# Patient Record
Sex: Female | Born: 1990
Health system: Southern US, Community
[De-identification: ages and names within clinical notes are randomized; demographics above are authoritative.]

## PROBLEM LIST (undated history)

## (undated) DIAGNOSIS — E282 Polycystic ovarian syndrome: Secondary | ICD-10-CM

## (undated) DIAGNOSIS — T7840XA Allergy, unspecified, initial encounter: Secondary | ICD-10-CM

## (undated) HISTORY — PX: WRIST SURGERY: SHX841

## (undated) HISTORY — DX: Allergy, unspecified, initial encounter: T78.40XA

---
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
## 2011-08-19 ENCOUNTER — Ambulatory Visit: Payer: Self-pay | Admitting: Family Medicine

## 2012-02-01 ENCOUNTER — Ambulatory Visit: Payer: Self-pay

## 2012-02-02 ENCOUNTER — Ambulatory Visit: Payer: Self-pay | Admitting: Internal Medicine

## 2012-02-02 LAB — URINALYSIS, COMPLETE
Bilirubin,UR: NEGATIVE
Glucose,UR: NEGATIVE mg/dL (ref 0–75)
Ketone: NEGATIVE
Nitrite: NEGATIVE
Ph: 6 (ref 4.5–8.0)
WBC UR: >30 /HPF (ref 0–5)

## 2012-02-02 LAB — WET PREP, GENITAL

## 2012-02-04 LAB — URINE CULTURE

## 2012-02-06 ENCOUNTER — Ambulatory Visit: Payer: Self-pay | Admitting: Internal Medicine

## 2012-02-18 ENCOUNTER — Ambulatory Visit: Payer: Self-pay

## 2013-03-22 ENCOUNTER — Ambulatory Visit: Payer: Self-pay

## 2014-02-14 ENCOUNTER — Ambulatory Visit: Payer: Self-pay | Admitting: Family Medicine

## 2014-02-14 LAB — URINALYSIS, COMPLETE
Bilirubin,UR: NEGATIVE
Glucose,UR: NEGATIVE mg/dL (ref 0–75)
Ketone: NEGATIVE
Nitrite: POSITIVE
PH: 6 (ref 4.5–8.0)
Protein: 30
Specific Gravity: >=1.03 (ref 1.003–1.030)

## 2014-02-14 LAB — PREGNANCY, URINE: PREGNANCY TEST, URINE: NEGATIVE m[IU]/mL

## 2014-02-16 LAB — URINE CULTURE

## 2014-09-30 ENCOUNTER — Ambulatory Visit: Payer: Self-pay | Admitting: Physician Assistant

## 2015-07-01 ENCOUNTER — Encounter: Payer: Self-pay | Admitting: Family Medicine

## 2015-07-01 ENCOUNTER — Ambulatory Visit (INDEPENDENT_AMBULATORY_CARE_PROVIDER_SITE_OTHER): Payer: 59 | Admitting: Family Medicine

## 2015-07-01 ENCOUNTER — Other Ambulatory Visit
Admission: RE | Admit: 2015-07-01 | Discharge: 2015-07-01 | Disposition: A | Discharge status: Home / Self Care | Payer: 59 | Source: Ambulatory Visit | Attending: Family Medicine | Admitting: Family Medicine

## 2015-07-01 VITALS — BP 122/70 | HR 78 | Temp 98.8°F | Resp 16 | Ht 61.0 in | Wt 211.2 lb

## 2015-07-01 DIAGNOSIS — Z713 Dietary counseling and surveillance: Secondary | ICD-10-CM

## 2015-07-01 DIAGNOSIS — R1084 Generalized abdominal pain: Secondary | ICD-10-CM | POA: Diagnosis not present

## 2015-07-01 DIAGNOSIS — E669 Obesity, unspecified: Secondary | ICD-10-CM

## 2015-07-01 DIAGNOSIS — R110 Nausea: Secondary | ICD-10-CM | POA: Diagnosis not present

## 2015-07-01 LAB — COMPREHENSIVE METABOLIC PANEL
ALK PHOS: 77 U/L (ref 38–126)
ALT: 15 U/L (ref 14–54)
AST: 18 U/L (ref 15–41)
Albumin: 4.2 g/dL (ref 3.5–5.0)
Anion gap: 7 (ref 5–15)
BUN: 13 mg/dL (ref 6–20)
CO2: 25 mmol/L (ref 22–32)
CREATININE: 0.72 mg/dL (ref 0.44–1.00)
Calcium: 9.4 mg/dL (ref 8.9–10.3)
Chloride: 106 mmol/L (ref 101–111)
GFR calc non Af Amer: >60 mL/min (ref >60)
GLUCOSE: 81 mg/dL (ref 65–99)
Potassium: 4 mmol/L (ref 3.5–5.1)
Sodium: 138 mmol/L (ref 135–145)
Total Bilirubin: 0.6 mg/dL (ref 0.3–1.2)
Total Protein: 7.9 g/dL (ref 6.5–8.1)

## 2015-07-01 LAB — POCT URINALYSIS DIPSTICK
Bilirubin, UA: NEGATIVE
Blood, UA: NEGATIVE
Glucose, UA: NEGATIVE
KETONES UA: NEGATIVE
LEUKOCYTES UA: NEGATIVE
Nitrite, UA: NEGATIVE
PH UA: 5
Protein, UA: NEGATIVE
SPEC GRAV UA: 1.02
Urobilinogen, UA: NEGATIVE

## 2015-07-01 LAB — CBC WITH DIFFERENTIAL/PLATELET
Basophils Absolute: 0.1 10*3/uL (ref 0–0.1)
Basophils Relative: 1 %
EOS PCT: 1 %
Eosinophils Absolute: 0.1 10*3/uL (ref 0–0.7)
HCT: 40.3 % (ref 35.0–47.0)
HEMOGLOBIN: 14 g/dL (ref 12.0–16.0)
LYMPHS PCT: 38 %
Lymphs Abs: 3 10*3/uL (ref 1.0–3.6)
MCH: 27.2 pg (ref 26.0–34.0)
MCHC: 34.7 g/dL (ref 32.0–36.0)
MCV: 78.6 fL — AB (ref 80.0–100.0)
MONOS PCT: 8 %
Monocytes Absolute: 0.7 10*3/uL (ref 0.2–0.9)
NEUTROS PCT: 52 %
Neutro Abs: 4.1 10*3/uL (ref 1.4–6.5)
Platelets: 317 10*3/uL (ref 150–440)
RBC: 5.13 MIL/uL (ref 3.80–5.20)
RDW: 15 % — ABNORMAL HIGH (ref 11.5–14.5)
WBC: 7.9 10*3/uL (ref 3.6–11.0)

## 2015-07-01 LAB — POCT URINE PREGNANCY: Preg Test, Ur: NEGATIVE

## 2015-07-01 MED ORDER — ONDANSETRON HCL 4 MG PO TABS: 4.0000 mg | ORAL_TABLET | Freq: Three times a day (TID) | ORAL | Status: DC | PRN

## 2015-07-01 MED ORDER — OMEPRAZOLE 20 MG PO CPDR: 20.0000 mg | DELAYED_RELEASE_CAPSULE | Freq: Every day | ORAL | Status: DC

## 2015-07-01 NOTE — Progress Notes (Signed)
Name: Brenetta Penny   MRN: [id number]    DOB: 1990-12-16   Date:07/01/2015       Progress Note  Subjective  Chief Complaint  Chief Complaint  Patient presents with  . Abdominal Pain    nauseated after eating for 2 weeks    Abdominal Pain This is a recurrent problem. The current episode started 1 to 4 weeks ago. The onset quality is gradual. The problem occurs 2 to 4 times per day. The problem has been gradually worsening. The pain is located in the epigastric region. The pain is mild. The quality of the pain is aching. The abdominal pain does not radiate. Associated symptoms include nausea. Pertinent negatives include no hematochezia, hematuria, vomiting or weight loss. Exacerbated by: Aggravated by work environment which is hot. The pain is relieved by nothing. She has tried nothing for the symptoms. There is no history of abdominal surgery, gallstones, GERD or pancreatitis.      Past Medical History  Diagnosis Date  . Allergy     History  Substance Use Topics  . Smoking status: Never Smoker   . Smokeless tobacco: Not on file  . Alcohol Use: 0.0 oz/week    0 Standard drinks or equivalent per week     Current outpatient prescriptions:  .  cetirizine (ZYRTEC) 10 MG chewable tablet, Chew 10 mg by mouth daily., Disp: , Rfl:   No Known Allergies  Review of Systems  Constitutional: Negative for weight loss.  Gastrointestinal: Positive for nausea and abdominal pain. Negative for vomiting and hematochezia.  Genitourinary: Negative for hematuria.     Objective  Filed Vitals:   07/01/15 0919  BP: 122/70  Pulse: 78  Temp: 98.8 F (37.1 C)  TempSrc: Oral  Resp: 16  Height:  (1.549 m)  Weight: 211 lb 3.2 oz (95.8 kg)  SpO2: 97%     Physical Exam  Constitutional: She is oriented to person, place, and time and well-developed, well-nourished, and in no distress.  Obese  HENT:  Head: Normocephalic.  Eyes: EOM are normal. Pupils are equal, round, and reactive  to light.  Neck: Normal range of motion. No thyromegaly present.  Cardiovascular: Normal rate, regular rhythm and normal heart sounds.   No murmur heard. Pulmonary/Chest: Effort normal and breath sounds normal.  Abdominal: Soft. Bowel sounds are normal. There is tenderness.  Bowel epigastric tenderness without rebound or masses  Musculoskeletal: Normal range of motion. She exhibits no edema.  Neurological: She is alert and oriented to person, place, and time. No cranial nerve deficit. Gait normal.  Skin: Skin is warm and dry. No rash noted.  Psychiatric: Memory and affect normal.      Assessment & Plan  1. Generalized abdominal pain Differential diagnosis includes physiologic response to not eating breakfast, gastritis, gallbladder disease. Also peptic ulcer disease regarding her obesity - POCT urinalysis dipstick - POCT urine pregnancy - omeprazole (PRILOSEC) 20 MG capsule; Take 1 capsule (20 mg total) by mouth daily.  Dispense: 30 capsule; Refill: 3 - CBC with Differential/Platelet - Comprehensive metabolic panel - US Abdomen Complete  2. Dietary counseling Regarding her obesity  3. Nausea  - ondansetron (ZOFRAN) 4 MG tablet; Take 1 tablet (4 mg total) by mouth every 8 (eight) hours as needed for nausea or vomiting.  Dispense: 20 tablet; Refill: 0  4. Obesity Handout

## 2015-07-01 NOTE — Patient Instructions (Signed)
Nausea and Vomiting Nausea is a sick feeling that often comes before throwing up (vomiting). Vomiting is a reflex where stomach contents come out of your mouth. Vomiting can cause severe loss of body fluids (dehydration). Children and elderly adults can become dehydrated quickly, especially if they also have diarrhea. Nausea and vomiting are symptoms of a condition or disease. It is important to find the cause of your symptoms. CAUSES   Direct irritation of the stomach lining. This irritation can result from increased acid production (gastroesophageal reflux disease), infection, food poisoning, taking certain medicines (such as nonsteroidal anti-inflammatory drugs), alcohol use, or tobacco use.  Signals from the brain.These signals could be caused by a headache, heat exposure, an inner ear disturbance, increased pressure in the brain from injury, infection, a tumor, or a concussion, pain, emotional stimulus, or metabolic problems.  An obstruction in the gastrointestinal tract (bowel obstruction).  Illnesses such as diabetes, hepatitis, gallbladder problems, appendicitis, kidney problems, cancer, sepsis, atypical symptoms of a heart attack, or eating disorders.  Medical treatments such as chemotherapy and radiation.  Receiving medicine that makes you sleep (general anesthetic) during surgery. DIAGNOSIS Your caregiver may ask for tests to be done if the problems do not improve after a few days. Tests may also be done if symptoms are severe or if the reason for the nausea and vomiting is not clear. Tests may include:  Urine tests.  Blood tests.  Stool tests.  Cultures (to look for evidence of infection).  X-rays or other imaging studies. Test results can help your caregiver make decisions about treatment or the need for additional tests. TREATMENT You need to stay well hydrated. Drink frequently but in small amounts.You may wish to drink water, sports drinks, clear broth, or eat frozen  ice pops or gelatin dessert to help stay hydrated.When you eat, eating slowly may help prevent nausea.There are also some antinausea medicines that may help prevent nausea. HOME CARE INSTRUCTIONS   Take all medicine as directed by your caregiver.  If you do not have an appetite, do not force yourself to eat. However, you must continue to drink fluids.  If you have an appetite, eat a normal diet unless your caregiver tells you differently.  Eat a variety of complex carbohydrates (rice, wheat, potatoes, bread), lean meats, yogurt, fruits, and vegetables.  Avoid high-fat foods because they are more difficult to digest.  Drink enough water and fluids to keep your urine clear or pale yellow.  If you are dehydrated, ask your caregiver for specific rehydration instructions. Signs of dehydration may include:  Severe thirst.  Dry lips and mouth.  Dizziness.  Dark urine.  Decreasing urine frequency and amount.  Confusion.  Rapid breathing or pulse. SEEK IMMEDIATE MEDICAL CARE IF:   You have blood or brown flecks (like coffee grounds) in your vomit.  You have black or bloody stools.  You have a severe headache or stiff neck.  You are confused.  You have severe abdominal pain.  You have chest pain or trouble breathing.  You do not urinate at least once every 8 hours.  You develop cold or clammy skin.  You continue to vomit for longer than 24 to 48 hours.  You have a fever. MAKE SURE YOU:   Understand these instructions.  Will watch your condition.  Will get help right away if you are not doing well or get worse. Document Released: 11/23/2005 Document Revised: 02/15/2012 Document Reviewed: 04/22/2011 [employer] Patient Information 2015 [employer]. This information is not intended   to replace advice given to you by your health care provider. Make sure you discuss any questions you have with your health care provider. Abdominal Pain Many things can cause abdominal  pain. Usually, abdominal pain is not caused by a disease and will improve without treatment. It can often be observed and treated at home. Your health care provider will do a physical exam and possibly order blood tests and X-rays to help determine the seriousness of your pain. However, in many cases, more time must pass before a clear cause of the pain can be found. Before that point, your health care provider may not know if you need more testing or further treatment. HOME CARE INSTRUCTIONS  Monitor your abdominal pain for any changes. The following actions may help to alleviate any discomfort you are experiencing:  Only take over-the-counter or prescription medicines as directed by your health care provider.  Do not take laxatives unless directed to do so by your health care provider.  Try a clear liquid diet (broth, tea, or water) as directed by your health care provider. Slowly move to a bland diet as tolerated. SEEK MEDICAL CARE IF:  You have unexplained abdominal pain.  You have abdominal pain associated with nausea or diarrhea.  You have pain when you urinate or have a bowel movement.  You experience abdominal pain that wakes you in the night.  You have abdominal pain that is worsened or improved by eating food.  You have abdominal pain that is worsened with eating fatty foods.  You have a fever. SEEK IMMEDIATE MEDICAL CARE IF:   Your pain does not go away within 2 hours.  You keep throwing up (vomiting).  Your pain is felt only in portions of the abdomen, such as the right side or the left lower portion of the abdomen.  You pass bloody or black tarry stools. MAKE SURE YOU:  Understand these instructions.   Will watch your condition.   Will get help right away if you are not doing well or get worse.  Document Released: 09/02/2005 Document Revised: 11/28/2013 Document Reviewed: 08/02/2013 [facility] Patient Information 2015 [place], [place]. This information is not  intended to replace advice given to you by your health care provider. Make sure you discuss any questions you have with your health care provider. Obesity Obesity is defined as having too much total body fat and a body mass index (BMI) of 30 or more. BMI is an estimate of body fat and is calculated from your height and weight. Obesity happens when you consume more calories than you can burn by exercising or performing daily physical tasks. Prolonged obesity can cause major illnesses or emergencies, such as:   Stroke.  Heart disease.  Diabetes.  Cancer.  Arthritis.  High blood pressure (hypertension).  High cholesterol.  Sleep apnea.  Erectile dysfunction.  Infertility problems. CAUSES   Regularly eating unhealthy foods.  Physical inactivity.  Certain disorders, such as an underactive thyroid (hypothyroidism), Cushing's syndrome, and polycystic ovarian syndrome.  Certain medicines, such as steroids, some depression medicines, and antipsychotics.  Genetics.  Lack of sleep. DIAGNOSIS  A health care provider can diagnose obesity after calculating your BMI. Obesity will be diagnosed if your BMI is 30 or higher.  There are other methods of measuring obesity levels. Some other methods include measuring your skinfold thickness, your waist circumference, and comparing your hip circumference to your waist circumference. TREATMENT  A healthy treatment program includes some or all of the following:  Long-term dietary changes.  Exercise and physical activity.  Behavioral and lifestyle changes.  Medicine only under the supervision of your health care provider. Medicines may help, but only if they are used with diet and exercise programs. An unhealthy treatment program includes:  Fasting.  Fad diets.  Supplements and drugs. These choices do not succeed in long-term weight control.  HOME CARE INSTRUCTIONS   Exercise and perform physical activity as directed by your health  care provider. To increase physical activity, try the following:  Use stairs instead of elevators.  Park farther away from store entrances.  Garden, bike, or walk instead of watching television or using the computer.  Eat healthy, low-calorie foods and drinks on a regular basis. Eat more fruits and vegetables. Use low-calorie cookbooks or take healthy cooking classes.  Limit fast food, sweets, and processed snack foods.  Eat smaller portions.  Keep a daily journal of everything you eat. There are many free websites to help you with this. It may be helpful to measure your foods so you can determine if you are eating the correct portion sizes.  Avoid drinking alcohol. Drink more water and drinks without calories.  Take vitamins and supplements only as recommended by your health care provider.  Weight-loss support groups, Government social research officer, counselors, and stress reduction education can also be very helpful. SEEK IMMEDIATE MEDICAL CARE IF:  You have chest pain or tightness.  You have trouble breathing or feel short of breath.  You have weakness or leg numbness.  You feel confused or have trouble talking.  You have sudden changes in your vision. MAKE SURE YOU:  Understand these instructions.  Will watch your condition.  Will get help right away if you are not doing well or get worse. Document Released: 12/31/2004 Document Revised: 04/09/2014 Document Reviewed: 12/30/2011 [facility] Patient Information 2015 [place], [place]. This information is not intended to replace advice given to you by your health care provider. Make sure you discuss any questions you have with your health care provider.

## 2015-07-02 ENCOUNTER — Telehealth: Payer: Self-pay | Admitting: Emergency Medicine

## 2015-07-02 NOTE — Telephone Encounter (Signed)
Patient notified of lab results

## 2015-07-03 ENCOUNTER — Telehealth: Payer: Self-pay

## 2015-07-03 NOTE — Telephone Encounter (Signed)
Add Omeprazole 20 mg daily, #30 Did she get abd [place] yet?

## 2015-07-03 NOTE — Telephone Encounter (Signed)
Pt stated that she is still having bad abdominal pains more so on left side. She has been feeling nauseous and has been attempting to eat. She would like to know what else can be done

## 2015-07-03 NOTE — Telephone Encounter (Signed)
Pt stated that she has appt tomorrow morning for [place] and I also went over her recent lab results with her.

## 2015-07-04 ENCOUNTER — Ambulatory Visit
Admission: RE | Admit: 2015-07-04 | Discharge: 2015-07-04 | Disposition: A | Discharge status: Home / Self Care | Payer: 59 | Source: Ambulatory Visit | Attending: Family Medicine | Admitting: Family Medicine

## 2015-07-04 ENCOUNTER — Telehealth: Payer: Self-pay | Admitting: Family Medicine

## 2015-07-04 DIAGNOSIS — R1084 Generalized abdominal pain: Secondary | ICD-10-CM | POA: Insufficient documentation

## 2015-07-04 MED ORDER — OMEPRAZOLE 20 MG PO CPDR: 20.0000 mg | DELAYED_RELEASE_CAPSULE | Freq: Every day | ORAL | Status: DC

## 2015-07-04 NOTE — Addendum Note (Signed)
Addended by: [name] on: 07/04/2015 09:00 AM   Modules accepted: Orders

## 2015-07-04 NOTE — Telephone Encounter (Signed)
Patient called back in severe pain and was checking status on her ultrasound report. Please return call

## 2015-07-05 NOTE — Telephone Encounter (Signed)
I do not see any results notes on this [place]

## 2015-07-08 ENCOUNTER — Other Ambulatory Visit: Payer: Self-pay

## 2015-07-08 DIAGNOSIS — R1032 Left lower quadrant pain: Secondary | ICD-10-CM

## 2015-07-09 ENCOUNTER — Encounter: Payer: Self-pay | Admitting: Gastroenterology

## 2015-07-10 ENCOUNTER — Encounter: Payer: Self-pay | Admitting: Nurse Practitioner

## 2015-07-25 ENCOUNTER — Ambulatory Visit (INDEPENDENT_AMBULATORY_CARE_PROVIDER_SITE_OTHER): Payer: 59 | Admitting: Nurse Practitioner

## 2015-07-25 ENCOUNTER — Encounter: Payer: Self-pay | Admitting: Nurse Practitioner

## 2015-07-25 VITALS — BP 100/74 | HR 72 | Ht 61.42 in | Wt 214.2 lb

## 2015-07-25 DIAGNOSIS — R1012 Left upper quadrant pain: Secondary | ICD-10-CM

## 2015-07-25 DIAGNOSIS — R110 Nausea: Secondary | ICD-10-CM

## 2015-07-25 MED ORDER — METHOCARBAMOL 500 MG PO TABS: 500.0000 mg | ORAL_TABLET | Freq: Every day | ORAL | Status: DC

## 2015-07-25 MED ORDER — SULINDAC 150 MG PO TABS: 150.0000 mg | ORAL_TABLET | Freq: Every day | ORAL | Status: DC

## 2015-07-25 NOTE — Patient Instructions (Addendum)
We have sent the following medications to your pharmacy for you to pick up at your convenience: Robaxin and Clinoril If no relief in pain after 5 days stop taking these meds  Please start taking omeprazole 30 min before breakfast  We will contact you regarding an appt time for EGD

## 2015-07-25 NOTE — Progress Notes (Signed)
Agree with initial assessment and plans as outlined 

## 2015-07-25 NOTE — Progress Notes (Signed)
HPI :   Patient is 24 year old female referred by PCP. Patient saw her PCP late July for epigastric pain and nausea. She was prescribed omeprazole and Zofran, sent for labs and u/s.  Labs, including a comprehensive metabolic profile and urine pregnancy test were negative. Hemoglobin 14, MCV 78. Ultrasound unremarkable.   Her upper abdominal pain and nausea started 4-6 weeks ago. Only one episode of actual vomiting. Patient does not want to eat because of nausea. She repeated a pregnancy test 2 days ago, it was negative. Patient takes an allergy pill, no other medications. No NSAID use. She did not start omeprazole or Zofran prescribed by PCP,  she wanted to wait for our evaluation. Patient complains of intermittent non-radiating epigastric and right upper quadrant pain.  Pain not related to eating. She notices the pain while standing up at work and also when trying to lay on left side in bed. Patient occasionally lifts heavy boxes at work. No weight loss. Bowel movements are normal.  Patient told she had polycystic ovary syndrome last year. She has not had a menstrual cycle in several months.  Past Medical History  Diagnosis Date  . Allergy     Family History  Problem Relation Age of Onset  . Hypertension Mother   . Diabetes Maternal Grandmother   . Diabetes Mother    Social History  Substance Use Topics  . Smoking status: Never Smoker   . Smokeless tobacco: Never Used  . Alcohol Use: No   Current Outpatient Prescriptions  Medication Sig Dispense Refill  . cetirizine (ZYRTEC) 10 MG chewable tablet Chew 10 mg by mouth daily.    Marland Kitchen omeprazole (PRILOSEC) 20 MG capsule Take 1 capsule (20 mg total) by mouth daily. (Patient not taking: Reported on 07/25/2015) 30 capsule 3  . ondansetron (ZOFRAN) 4 MG tablet Take 1 tablet (4 mg total) by mouth every 8 (eight) hours as needed for nausea or vomiting. (Patient not taking: Reported on 07/25/2015) 20 tablet 0   No current facility-administered  medications for this visit.   No Known Allergies   Review of Systems: All systems reviewed and negative except where noted in HPI.    US Abdomen Complete  07/04/2015   CLINICAL DATA:  Generalized abdominal pain for the past 2 weeks, postprandial nausea, left flank pain.  EXAM: ULTRASOUND ABDOMEN COMPLETE  COMPARISON:  None in PACs  FINDINGS: Gallbladder: No gallstones or wall thickening visualized. No sonographic Murphy sign noted.  Common bile duct: Diameter: 3 mm  Liver: No focal lesion identified. Within normal limits in parenchymal echogenicity.  IVC: Visualized portions unremarkable.  Pancreas: The pancreatic head and body are grossly normal. The pancreatic tail is obscured by bowel gas.  Spleen: Size and appearance within normal limits.  Right Kidney: Length: 8.7 cm. Echogenicity within normal limits. No mass or hydronephrosis visualized.  Left Kidney: Length: 8.9 cm. Echogenicity within normal limits. No mass or hydronephrosis visualized.  Abdominal aorta: No aneurysm visualized.  Other findings: There is no ascites.  IMPRESSION: 1. There is no acute hepatobiliary abnormality. The pancreas was partially obscured by bowel gas. If gallbladder dysfunction remains suspected clinically, nuclear medicine hepatobiliary scanning would be useful. 2. No hydronephrosis or calcified kidney stones are observed.   Electronically Signed   By: David  [place] M.D.   On: 07/04/2015 10:28    Physical Exam: BP 100/74 mmHg  Pulse 72  Ht 5' 1.42" (1.56 m)  Wt 214 lb 4 oz (97.183 kg)  BMI 39.93 kg/m2  LMP 02/27/2015 (Exact Date) Constitutional: Pleasant, obese black female in no acute distress. HEENT: Normocephalic and atraumatic. Conjunctivae are normal. No scleral icterus. Neck supple.  Cardiovascular: Normal rate, regular rhythm.  Pulmonary/chest: Effort normal and breath sounds normal. No wheezing, rales or rhonchi. Abdominal: Soft, nondistended, mild LUQ (lateral) tenderness. Bowel sounds active  throughout. There are no masses palpable. No hepatomegaly. Extremities: no edema Lymphadenopathy: No cervical adenopathy noted. Neurological: Alert and oriented to person place and time. Skin: Skin is warm and dry. No rashes noted. Psychiatric: Normal mood and affect. Behavior is normal.   ASSESSMENT AND PLAN: 59. 24 year old female with 4-6 week history of nausea, predominantly postprandial. Etiology unclear. Two negative pregnancy tests. Gallbladder normal on ultrasound. Labs unremarkable. Rule out gastritis, peptic ulcer disease. For further evaluation patient will be scheduled for upper endoscopy. The benefits, risks, and potential complications of EGD with possible biopsies were discussed with the patient and she agrees to proceed. Patient hasn't yet started Omeprazole but I encouraged her to do so.    2. Left upper abdominal pain. May be GI in origin but based on history and exam musculoskeletal pain also possible.  Trial of Clinoril in am and Robaxin at bedtime for 5 days. If no improvement, discontinue both.  Further recommendations pending clinical course.   3. Obesity  Cc: [name], MD

## 2015-07-26 ENCOUNTER — Encounter: Payer: Self-pay | Admitting: Nurse Practitioner

## 2015-08-06 ENCOUNTER — Telehealth: Payer: Self-pay | Admitting: *Deleted

## 2015-08-06 ENCOUNTER — Encounter: Payer: Self-pay | Admitting: *Deleted

## 2015-08-06 NOTE — Telephone Encounter (Signed)
Called patient several times to schedule appt for [facility], no answer msg left to contact office.

## 2015-08-06 NOTE — Telephone Encounter (Signed)
-----   Message from Lamonte Sakai, RN sent at 07/25/2015 11:28 AM EDT ----- Regarding: Needs EGD No open spots at this time in [facility] for EGD with Dr. Marina Goodell. Will check back in a few days to see if schedule opened up. Pt will need appt with pre visit to go over prep. Needs to be scheduled for [facility] with Dr. Marina Goodell.

## 2015-08-14 NOTE — Telephone Encounter (Signed)
Spoke to patient and she refused to schedule appt for EGD with Dr. Marina Goodell. She states that she is feeling so much better and will contact us if her symptoms return or get worse.

## 2015-08-19 NOTE — Telephone Encounter (Signed)
Okay, please convert this to a phone note. Thanks

## 2015-08-20 ENCOUNTER — Ambulatory Visit: Payer: 59 | Admitting: Gastroenterology

## 2015-12-16 ENCOUNTER — Ambulatory Visit (INDEPENDENT_AMBULATORY_CARE_PROVIDER_SITE_OTHER): Payer: 59

## 2015-12-16 ENCOUNTER — Ambulatory Visit
Admission: EM | Admit: 2015-12-16 | Discharge: 2015-12-16 | Disposition: A | Discharge status: Home / Self Care | Payer: 59 | Attending: Family Medicine | Admitting: Family Medicine

## 2015-12-16 ENCOUNTER — Encounter: Payer: Self-pay | Admitting: *Deleted

## 2015-12-16 DIAGNOSIS — S161XXA Strain of muscle, fascia and tendon at neck level, initial encounter: Secondary | ICD-10-CM

## 2015-12-16 DIAGNOSIS — S63501A Unspecified sprain of right wrist, initial encounter: Secondary | ICD-10-CM

## 2015-12-16 DIAGNOSIS — S6991XA Unspecified injury of right wrist, hand and finger(s), initial encounter: Secondary | ICD-10-CM | POA: Diagnosis not present

## 2015-12-16 DIAGNOSIS — S199XXA Unspecified injury of neck, initial encounter: Secondary | ICD-10-CM | POA: Diagnosis not present

## 2015-12-16 DIAGNOSIS — W19XXXA Unspecified fall, initial encounter: Secondary | ICD-10-CM

## 2015-12-16 DIAGNOSIS — M542 Cervicalgia: Secondary | ICD-10-CM | POA: Diagnosis not present

## 2015-12-16 DIAGNOSIS — M25531 Pain in right wrist: Secondary | ICD-10-CM | POA: Diagnosis not present

## 2015-12-16 MED ORDER — CYCLOBENZAPRINE HCL 10 MG PO TABS: 10.0000 mg | ORAL_TABLET | Freq: Every day | ORAL | Status: DC

## 2015-12-16 MED ORDER — HYDROCODONE-ACETAMINOPHEN 5-325 MG PO TABS: 1.0000 | ORAL_TABLET | Freq: Four times a day (QID) | ORAL | Status: DC | PRN

## 2015-12-16 NOTE — ED Provider Notes (Signed)
CSN: [id number]     Arrival date & time 12/16/15  0914 History   First MD Initiated Contact with Patient 12/16/15 [phone]     Chief Complaint  Patient presents with  . Neck Injury  . Back Pain   (Consider location/radiation/quality/duration/timing/severity/associated sxs/prior Treatment) HPI Comments: 25 yo female presents with a c/o right hand/wrist pain and neck pain since falling on ice yesterday. Denies hitting her head or loss of consciousness.   The history is provided by the patient.    Past Medical History  Diagnosis Date  . Allergy    Past Surgical History  Procedure Laterality Date  . Wrist surgery Left    Family History  Problem Relation Age of Onset  . Hypertension Mother   . Diabetes Maternal Grandmother   . Diabetes Mother    Social History  Substance Use Topics  . Smoking status: Never Smoker   . Smokeless tobacco: Never Used  . Alcohol Use: No   OB History    No data available     Review of Systems  Allergies  Review of patient's allergies indicates no known allergies.  Home Medications   Prior to Admission medications   Medication Sig Start Date End Date Taking? Authorizing Provider  cetirizine (ZYRTEC) 10 MG chewable tablet Chew 10 mg by mouth daily.   Yes Historical Provider, MD  cyclobenzaprine (FLEXERIL) 10 MG tablet Take 1 tablet (10 mg total) by mouth at bedtime. 12/16/15   Payton Mccallumrlando Beau Vanduzer, MD  HYDROcodone-acetaminophen (NORCO/VICODIN) 5-325 MG tablet Take 1-2 tablets by mouth every 6 (six) hours as needed. 12/16/15   Payton Mccallumrlando Kobie Whidby, MD  methocarbamol (ROBAXIN) 500 MG tablet Take 1 tablet (500 mg total) by mouth at bedtime. For 5 days 07/25/15   Meredith PelPaula M Guenther, NP  omeprazole (PRILOSEC) 20 MG capsule Take 1 capsule (20 mg total) by mouth daily. 07/04/15   Dennison MascotLemont Morrisey, MD  sulindac (CLINORIL) 150 MG tablet Take 1 tablet (150 mg total) by mouth daily. In the morning after breakfast for 5 days 07/25/15   Meredith PelPaula M Guenther, NP   Meds Ordered and  Administered this Visit  Medications - No data to display  BP 118/79 mmHg  Pulse 71  Temp(Src) 97.9 F (36.6 C) (Oral)  Resp 18  Ht 5\' 2"  (1.575 m)  Wt 210 lb (95.255 kg)  BMI 38.40 kg/m2  SpO2 97%  LMP 12/13/2015 No data found.   Physical Exam  Constitutional: She appears well-developed and well-nourished. No distress.  Musculoskeletal: She exhibits tenderness. She exhibits no edema.       Right wrist: She exhibits bony tenderness and swelling. She exhibits normal range of motion, no effusion, no crepitus, no deformity and no laceration.       Thoracic back: She exhibits tenderness (mild over the paraspinous muscles) and spasm. She exhibits normal range of motion, no bony tenderness, no swelling, no edema, no deformity, no laceration, no pain and normal pulse.       Lumbar back: She exhibits normal range of motion, no bony tenderness, no swelling, no edema, no deformity, no laceration, no pain and normal pulse.       Right hand: Normal. Normal sensation noted. Normal strength noted.  Neurological: She is alert. She has normal reflexes. She exhibits normal muscle tone.  Skin: Skin is warm and dry. No rash noted. She is not diaphoretic. No erythema.  Nursing note and vitals reviewed.   ED Course  Procedures (including critical care time)  Labs Review Labs Reviewed -  No data to display  Imaging Review No results found.   Visual Acuity Review  Right Eye Distance:   Left Eye Distance:   Bilateral Distance:    Right Eye Near:   Left Eye Near:    Bilateral Near:         MDM   1. Wrist sprain, right, initial encounter   2. Neck strain, initial encounter   3. Fall, initial encounter    Discharge Medication List as of 12/16/2015 10:44 AM    START taking these medications   Details  cyclobenzaprine (FLEXERIL) 10 MG tablet Take 1 tablet (10 mg total) by mouth at bedtime., Starting 12/16/2015, Until Discontinued, Normal    HYDROcodone-acetaminophen (NORCO/VICODIN)  5-325 MG tablet Take 1-2 tablets by mouth every 6 (six) hours as needed., Starting 12/16/2015, Until Discontinued, Print       1. x-ray results (negative for acute fracture) and diagnosis reviewed with patient 2. rx as per orders above; reviewed possible side effects, interactions, risks and benefits  3. Recommend supportive treatment with rest, ice, otc analgesics prn 4. Follow-up prn if symptoms worsen or don't improve    Payton Mccallum, MD 01/01/16 [phone]

## 2015-12-16 NOTE — ED Notes (Signed)
Patient reports falling on ice yesterday 12/14/14 and hurting her right hand, neck and back.  Swelling is evident in patient's right hand. Patient reports that she is unable to find a comfort position.

## 2015-12-16 NOTE — Discharge Instructions (Signed)
Cervical Sprain °A cervical sprain is an injury in the neck in which the strong, fibrous tissues (ligaments) that connect your neck bones stretch or tear. Cervical sprains can range from mild to severe. Severe cervical sprains can cause the neck vertebrae to be unstable. This can lead to damage of the spinal cord and can result in serious nervous system problems. The amount of time it takes for a cervical sprain to get better depends on the cause and extent of the injury. Most cervical sprains heal in 1 to 3 weeks. °CAUSES  °Severe cervical sprains may be caused by:  °· Contact sport injuries (such as from football, rugby, wrestling, hockey, auto racing, gymnastics, diving, martial arts, or boxing).   °· Motor vehicle collisions.   °· Whiplash injuries. This is an injury from a sudden forward and backward whipping movement of the head and neck.  °· Falls.   °Mild cervical sprains may be caused by:  °· Being in an awkward position, such as while cradling a telephone between your ear and shoulder.   °· Sitting in a chair that does not offer proper support.   °· Working at a poorly designed computer station.   °· Looking up or down for long periods of time.   °SYMPTOMS  °· Pain, soreness, stiffness, or a burning sensation in the front, back, or sides of the neck. This discomfort may develop immediately after the injury or slowly, 24 hours or more after the injury.   °· Pain or tenderness directly in the middle of the back of the neck.   °· Shoulder or upper back pain.   °· Limited ability to move the neck.   °· Headache.   °· Dizziness.   °· Weakness, numbness, or tingling in the hands or arms.   °· Muscle spasms.   °· Difficulty swallowing or chewing.   °· Tenderness and swelling of the neck.   °DIAGNOSIS  °Most of the time your health care provider can diagnose a cervical sprain by taking your history and doing a physical exam. Your health care provider will ask about previous neck injuries and any known neck  problems, such as arthritis in the neck. X-rays may be taken to find out if there are any other problems, such as with the bones of the neck. Other tests, such as a CT scan or MRI, may also be needed.  °TREATMENT  °Treatment depends on the severity of the cervical sprain. Mild sprains can be treated with rest, keeping the neck in place (immobilization), and pain medicines. Severe cervical sprains are immediately immobilized. Further treatment is done to help with pain, muscle spasms, and other symptoms and may include: °· Medicines, such as pain relievers, numbing medicines, or muscle relaxants.   °· Physical therapy. This may involve stretching exercises, strengthening exercises, and posture training. Exercises and improved posture can help stabilize the neck, strengthen muscles, and help stop symptoms from returning.   °HOME CARE INSTRUCTIONS  °· Put ice on the injured area.   °¨ Put ice in a plastic bag.   °¨ Place a towel between your skin and the bag.   °¨ Leave the ice on for 15-20 minutes, 3-4 times a day.   °· If your injury was severe, you may have been given a cervical collar to wear. A cervical collar is a two-piece collar designed to keep your neck from moving while it heals. °¨ Do not remove the collar unless instructed by your health care provider. °¨ If you have long hair, keep it outside of the collar. °¨ Ask your health care provider before making any adjustments to your collar. Minor   adjustments may be required over time to improve comfort and reduce pressure on your chin or on the back of your head.  Ifyou are allowed to remove the collar for cleaning or bathing, follow your health care provider's instructions on how to do so safely.  Keep your collar clean by wiping it with mild soap and water and drying it completely. If the collar you have been given includes removable pads, remove them every 1-2 days and hand wash them with soap and water. Allow them to air dry. They should be completely  dry before you wear them in the collar.  If you are allowed to remove the collar for cleaning and bathing, wash and dry the skin of your neck. Check your skin for irritation or sores. If you see any, tell your health care provider.  Do not drive while wearing the collar.   Only take over-the-counter or prescription medicines for pain, discomfort, or fever as directed by your health care provider.   Keep all follow-up appointments as directed by your health care provider.   Keep all physical therapy appointments as directed by your health care provider.   Make any needed adjustments to your workstation to promote good posture.   Avoid positions and activities that make your symptoms worse.   Warm up and stretch before being active to help prevent problems.  SEEK MEDICAL CARE IF:   Your pain is not controlled with medicine.   You are unable to decrease your pain medicine over time as planned.   Your activity level is not improving as expected.  SEEK IMMEDIATE MEDICAL CARE IF:   You develop any bleeding.  You develop stomach upset.  You have signs of an allergic reaction to your medicine.   Your symptoms get worse.   You develop new, unexplained symptoms.   You have numbness, tingling, weakness, or paralysis in any part of your body.  MAKE SURE YOU:   Understand these instructions.  Will watch your condition.  Will get help right away if you are not doing well or get worse.   This information is not intended to replace advice given to you by your health care provider. Make sure you discuss any questions you have with your health care provider.   Document Released: 09/20/2007 Document Revised: 11/28/2013 Document Reviewed: 05/31/2013 Elsevier Interactive Patient Education 2016 [employer] Wrist Pain There are many things that can cause wrist pain. Some common causes include:  An injury to the wrist area, such as a sprain, strain, or  fracture.  Overuse of the joint.  A condition that causes increased pressure on a nerve in the wrist (carpal tunnel syndrome).  Wear and tear of the joints that occurs with aging (osteoarthritis).  A variety of other types of arthritis. Sometimes, the cause of wrist pain is not known. The pain often goes away when you follow your health care provider's instructions for relieving pain at home. If your wrist pain continues, tests may need to be done to diagnose your condition. HOME CARE INSTRUCTIONS Pay attention to any changes in your symptoms. Take these actions to help with your pain:  Rest the wrist area for at least 48 hours or as told by your health care provider.  If directed, apply ice to the injured area:  Put ice in a plastic bag.  Place a towel between your skin and the bag.  Leave the ice on for 20 minutes, 2-3 times per day.  Keep your arm raised (elevated) above  the level of your heart while you are sitting or lying down.  If a splint or elastic bandage has been applied, use it as told by your health care provider.  Remove the splint or bandage only as told by your health care provider.  Loosen the splint or bandage if your fingers become numb or have a tingling feeling, or if they turn cold or blue.  Take over-the-counter and prescription medicines only as told by your health care provider.  Keep all follow-up visits as told by your health care provider. This is important. SEEK MEDICAL CARE IF:  Your pain is not helped by treatment.  Your pain gets worse. SEEK IMMEDIATE MEDICAL CARE IF:  Your fingers become swollen.  Your fingers turn white, very red, or cold and blue.  Your fingers are numb or have a tingling feeling.  You have difficulty moving your fingers.   This information is not intended to replace advice given to you by your health care provider. Make sure you discuss any questions you have with your health care provider.   Document Released:  09/02/2005 Document Revised: 08/14/2015 Document Reviewed: 04/10/2015 Elsevier Interactive Patient Education [employer]

## 2016-02-04 ENCOUNTER — Ambulatory Visit (INDEPENDENT_AMBULATORY_CARE_PROVIDER_SITE_OTHER): Payer: 59 | Admitting: Family Medicine

## 2016-02-04 ENCOUNTER — Encounter: Payer: Self-pay | Admitting: Family Medicine

## 2016-02-04 ENCOUNTER — Other Ambulatory Visit
Admission: RE | Admit: 2016-02-04 | Discharge: 2016-02-04 | Disposition: A | Discharge status: Home / Self Care | Payer: 59 | Source: Ambulatory Visit | Attending: Family Medicine | Admitting: Family Medicine

## 2016-02-04 VITALS — BP 116/78 | HR 110 | Temp 98.0°F | Resp 14 | Ht 62.0 in | Wt 219.3 lb

## 2016-02-04 DIAGNOSIS — E282 Polycystic ovarian syndrome: Secondary | ICD-10-CM

## 2016-02-04 DIAGNOSIS — N926 Irregular menstruation, unspecified: Secondary | ICD-10-CM | POA: Insufficient documentation

## 2016-02-04 LAB — HCG, QUANTITATIVE, PREGNANCY: hCG, Beta Chain, Quant, S: <1 m[IU]/mL (ref <5)

## 2016-02-04 LAB — POCT URINE PREGNANCY: Preg Test, Ur: NEGATIVE

## 2016-02-04 NOTE — Progress Notes (Signed)
Name: Brenda Reese   MRN: [id number]    DOB: Mar 01, 1991   Date:02/04/2016       Progress Note  Subjective  Chief Complaint  Chief Complaint  Patient presents with  . Amenorrhea    Patient here for pregnancy test today.    HPI  Brenda Reese is a 25 year old female patient who is here today with concerns of missed menses. LMP 01/02/16. She reports a history of PCOS and [facility] on [facility] but when her OCP brand changed she had prolonged menses for 2 months so her OCPs were discontinued in 2016. It took her 6 months of anovulation and no menses before finally having a menses, now they are irregular and hard to predict. She did have unprotected sexual intercourse. Not having pelvic pain, discharge, mood swings, fatigue.    Past Medical History  Diagnosis Date  . Allergy     Patient Active Problem List   Diagnosis Date Noted  . Abdominal pain, left upper quadrant 07/25/2015  . Nausea without vomiting 07/25/2015  . Generalized abdominal pain 07/01/2015  . Dietary counseling 07/01/2015  . Nausea 07/01/2015  . Obesity 07/01/2015    Social History  Substance Use Topics  . Smoking status: Never Smoker   . Smokeless tobacco: Never Used  . Alcohol Use: No     Current outpatient prescriptions:  .  cetirizine (ZYRTEC) 10 MG chewable tablet, Chew 10 mg by mouth daily., Disp: , Rfl:   No Known Allergies  Review of Systems  Positive for amenorrhea as mentioned in HPI, otherwise all systems reviewed and are negative.  Objective  BP 116/78 mmHg  Pulse 110  Temp(Src) 98 F (36.7 C) (Oral)  Resp 14  Ht  (1.575 m)  Wt 219 lb 4.8 oz (99.474 kg)  BMI 40.10 kg/m2  SpO2 96%  LMP 01/02/2016 (Exact Date)  Body mass index is 40.1 kg/(m^2).   Physical Exam  Constitutional: Patient is obese and well-nourished. In no distress.   Cardiovascular: Normal rate, regular rhythm and normal heart sounds.  No murmur heard.  Pulmonary/Chest:  Effort normal and breath sounds normal. No respiratory distress. Abdomen: Soft, obese, NT/ND, Normal BS, no HSM.  Musculoskeletal: Normal range of motion bilateral UE and LE, no joint effusions. Peripheral vascular: Bilateral LE no edema. Neurological: CN II-XII grossly intact with no focal deficits. Alert and oriented to person, place, and time. Coordination, balance, strength, speech and gait are normal.  Skin: Skin is warm and dry. No rash noted. No erythema.  Psychiatric: Patient has a normal mood and affect. Behavior is normal in office today. Judgment and thought content normal in office today.  Results for orders placed or performed in visit on 02/04/16 (from the past 24 hour(s))  POCT urine pregnancy     Status: Normal   Collection Time: 02/04/16 10:45 AM  Result Value Ref Range   Preg Test, Ur Negative Negative     Assessment & Plan  1. Missed period Urine pregnancy testing in office negative. Likely irregular cycles are due to PCOS with irregular cycles however patient wanted serum blood testing for pregnancy.  - POCT urine pregnancy - B-HCG Quant  2. PCOS (polycystic ovarian syndrome) Offered RX for OCPs she declined.

## 2016-04-02 DIAGNOSIS — N970 Female infertility associated with anovulation: Secondary | ICD-10-CM | POA: Diagnosis not present

## 2016-04-09 IMAGING — US US ABDOMEN COMPLETE
1 series · 14 of 25 positions shown · non-contrast
Comparison: None in PACs

CLINICAL DATA: Generalized abdominal pain for the past 2 weeks,
postprandial nausea, left flank pain.

EXAM:
ULTRASOUND ABDOMEN COMPLETE

[Series 1: us abdomen complete · 0.17mm/px · 14 of 123 slices shown]
[im 1/123]
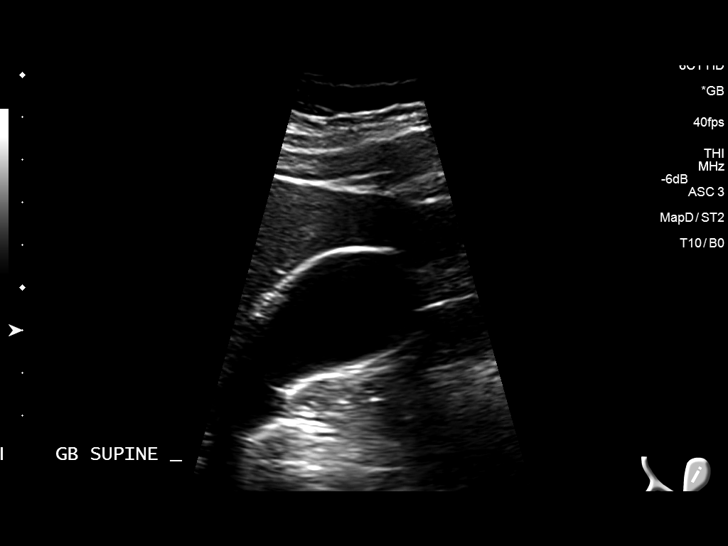
[im 11/123]
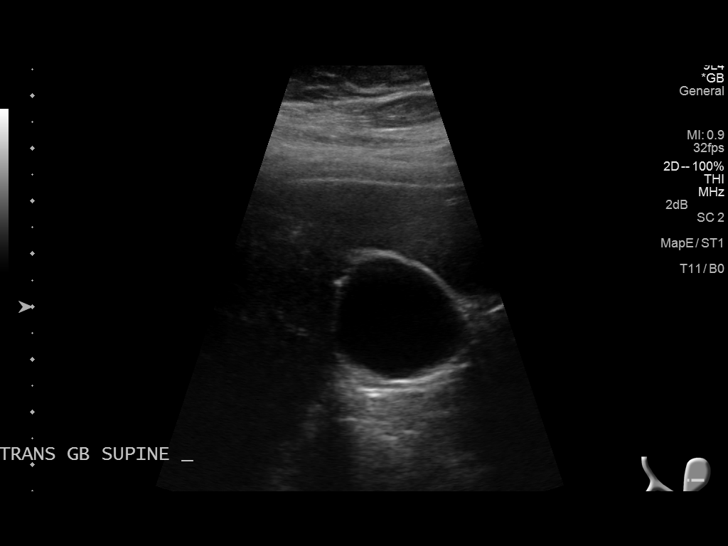
[im 21/123]
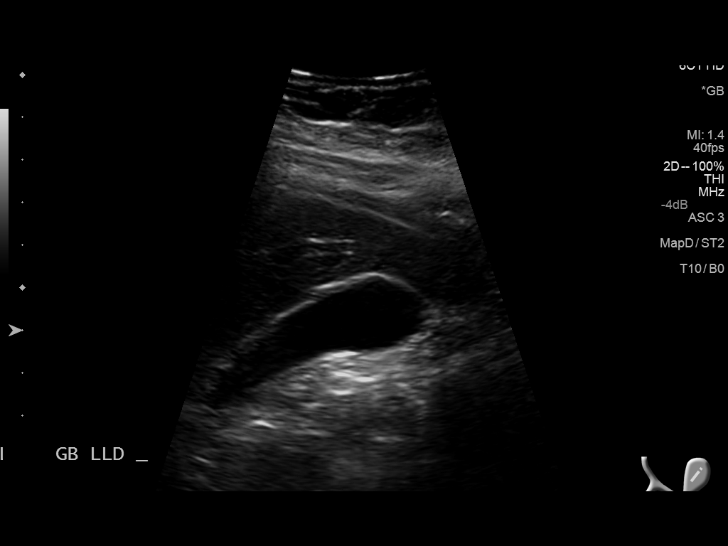
[im 31/123]
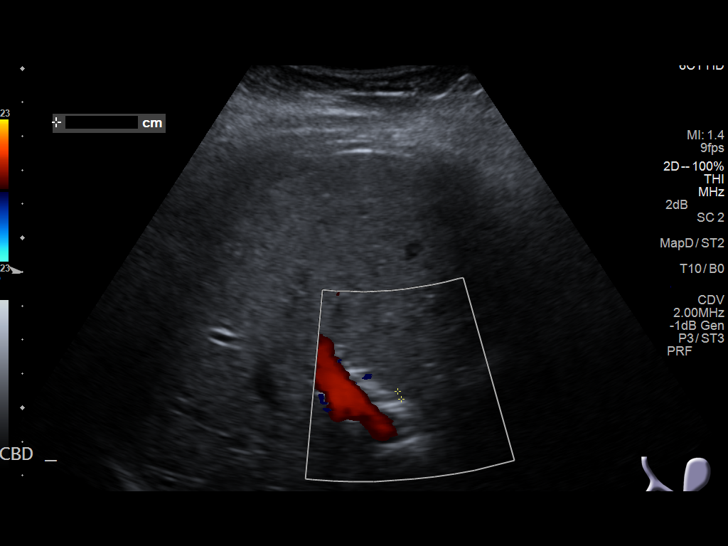
[im 41/123]
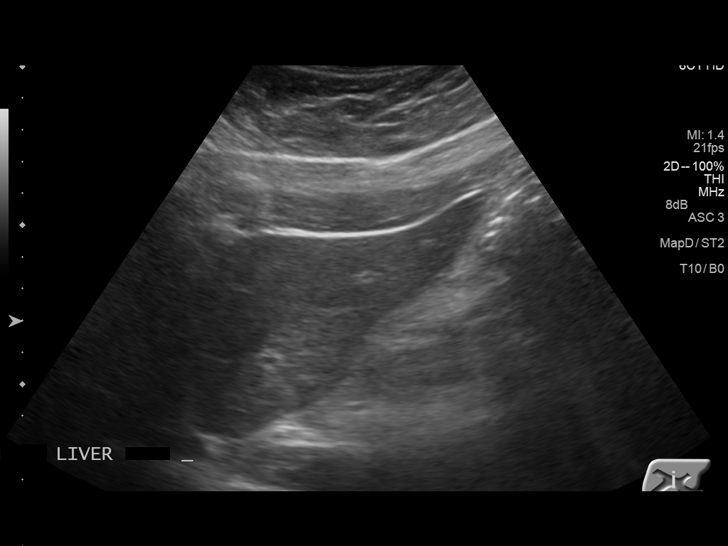
[im 46/123]
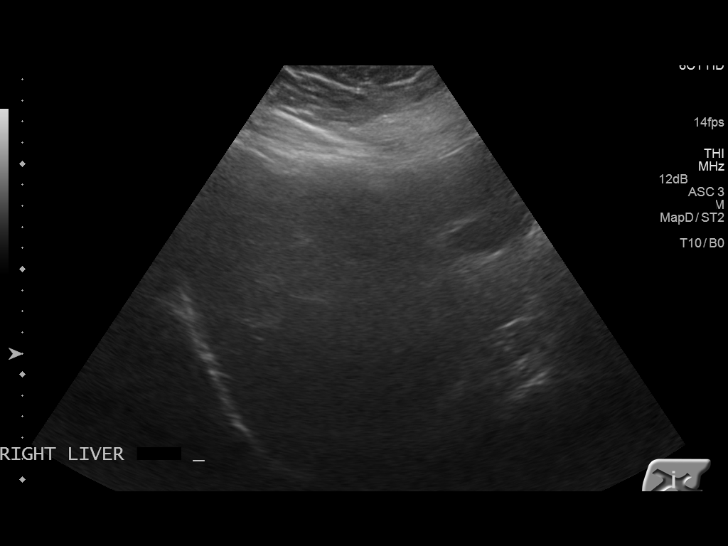
[im 56/123]
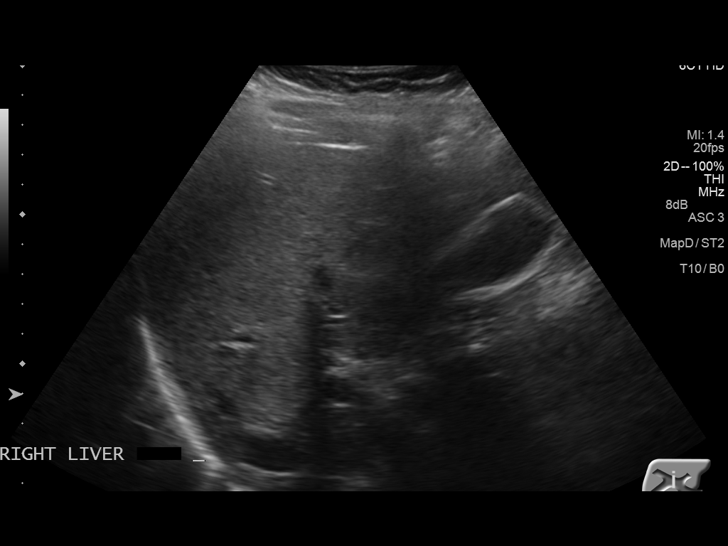
[im 67/123]
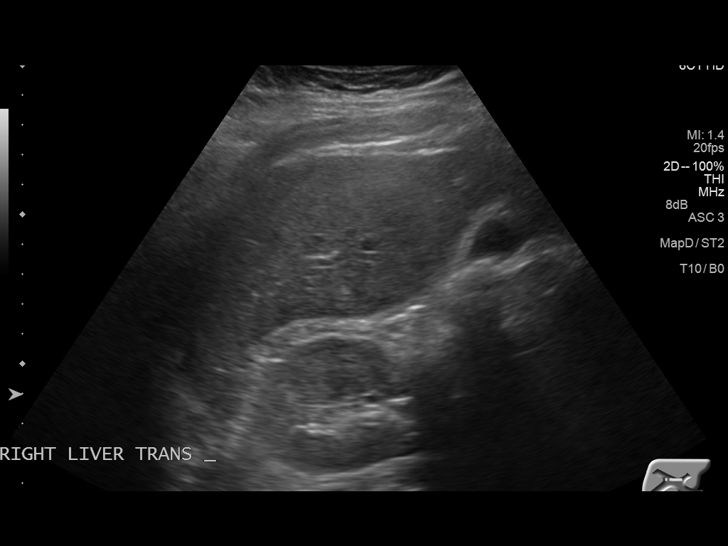
[im 77/123]
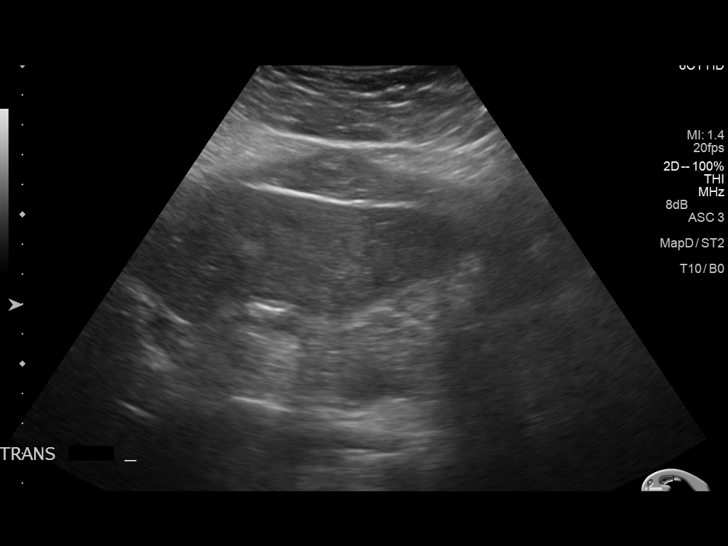
[im 82/123]
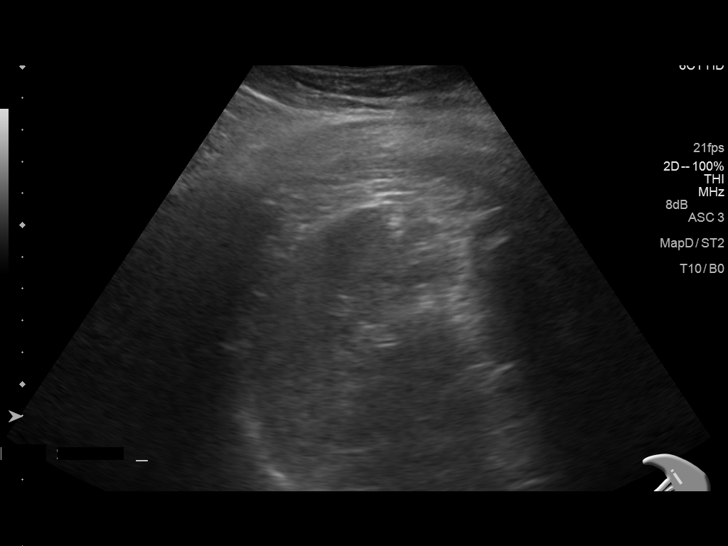
[im 92/123]
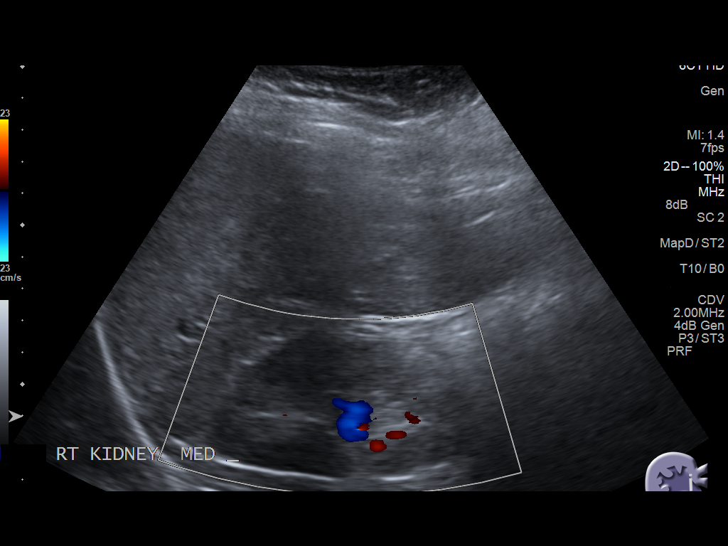
[im 102/123]
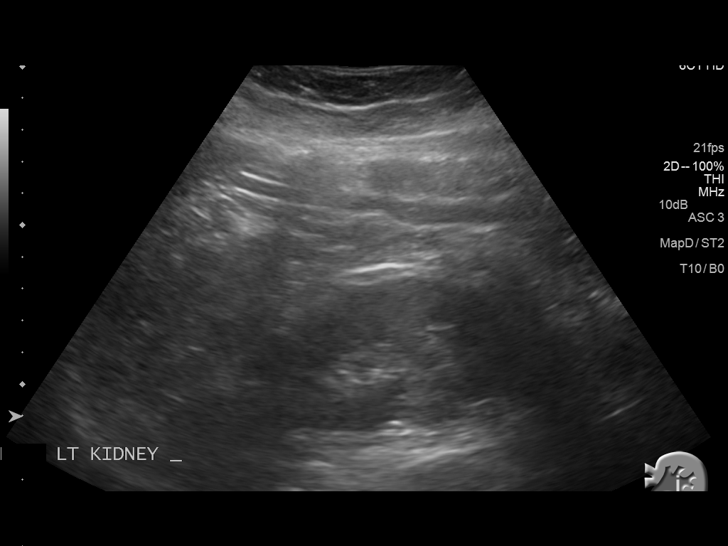
[im 112/123]
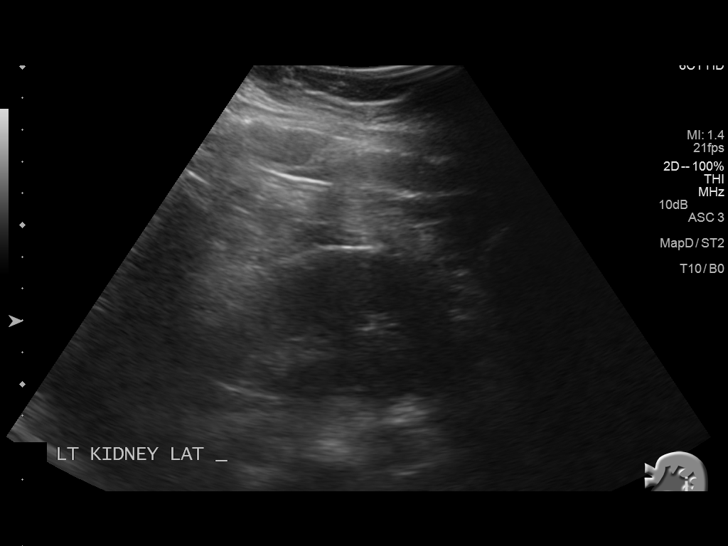
[im 123/123]
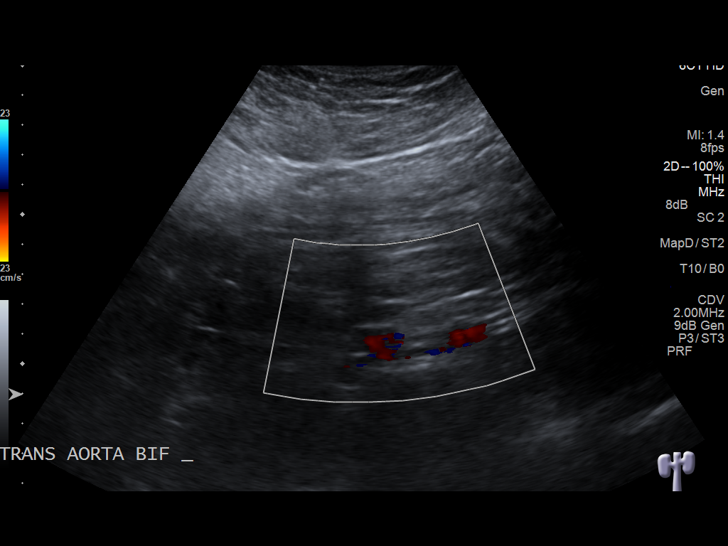

[14 of 25 positions shown; findings below may reference images not displayed]

FINDINGS: Gallbladder: No gallstones or wall thickening visualized. No
sonographic Murphy sign noted.

Common bile duct: Diameter: 3 mm

Liver: No focal lesion identified. Within normal limits in
parenchymal echogenicity.

IVC: Visualized portions unremarkable.

Pancreas: The pancreatic head and body are grossly normal. The
pancreatic tail is obscured by bowel gas.

Spleen: Size and appearance within normal limits.

Right Kidney: Length: 8.7 cm. Echogenicity within normal limits. No
mass or hydronephrosis visualized.

Left Kidney: Length: 8.9 cm. Echogenicity within normal limits. No
mass or hydronephrosis visualized.

Abdominal aorta: No aneurysm visualized.

Other findings: There is no ascites.
IMPRESSION: 1. There is no acute hepatobiliary abnormality. The pancreas was
partially obscured by bowel gas. If gallbladder dysfunction remains
suspected clinically, nuclear medicine hepatobiliary scanning would
be useful.
2. No hydronephrosis or calcified kidney stones are observed.

## 2017-09-29 ENCOUNTER — Encounter: Payer: Self-pay | Admitting: Obstetrics and Gynecology

## 2017-09-29 ENCOUNTER — Ambulatory Visit (INDEPENDENT_AMBULATORY_CARE_PROVIDER_SITE_OTHER): Payer: 59 | Admitting: Obstetrics and Gynecology

## 2017-09-29 VITALS — BP 124/70 | Ht 62.0 in | Wt 218.0 lb

## 2017-09-29 DIAGNOSIS — Z01419 Encounter for gynecological examination (general) (routine) without abnormal findings: Secondary | ICD-10-CM | POA: Diagnosis not present

## 2017-09-29 DIAGNOSIS — Z1339 Encounter for screening examination for other mental health and behavioral disorders: Secondary | ICD-10-CM

## 2017-09-29 DIAGNOSIS — Z1331 Encounter for screening for depression: Secondary | ICD-10-CM | POA: Diagnosis not present

## 2017-09-29 DIAGNOSIS — M545 Low back pain: Secondary | ICD-10-CM

## 2017-09-29 DIAGNOSIS — Z124 Encounter for screening for malignant neoplasm of cervix: Secondary | ICD-10-CM | POA: Diagnosis not present

## 2017-09-29 DIAGNOSIS — Z113 Encounter for screening for infections with a predominantly sexual mode of transmission: Secondary | ICD-10-CM

## 2017-09-29 DIAGNOSIS — G8929 Other chronic pain: Secondary | ICD-10-CM | POA: Diagnosis not present

## 2017-09-29 NOTE — Progress Notes (Signed)
Gynecology Annual Exam  PCP: Dennison MascotMorrisey, [place], MD  Chief Complaint  Patient presents with  . Annual Exam   History of Present Illness:  [name] is a 26 y.o. G0P0000 who LMP was Patient's last menstrual period was 09/15/2017., presents today for her annual examination.  Her menses are regular every 28-30 days, lasting 5 day(s).  Dysmenorrhea mild, occurring first 1-2 days of flow. She does not have intermenstrual bleeding.  She is single partner, contraception - condoms most of the time.  Last Pap: 01/2015  Results were: no abnormalities /neg HPV DNA not done Hx of STDs: distant history of gonorrhea  There is no FH of breast cancer. There is no FH of ovarian cancer. The patient does not do self-breast exams.  Tobacco use: The patient denies current or previous tobacco use. Alcohol use: social drinker Exercise: moderately active  The patient wears seatbelts: yes.   The patient reports that domestic violence in her life is absent.   Interested in breast reduction. States that she started taking birth control pills and since that time her menses have regulated, even as she has stopped taking them.   Past Medical History:  Diagnosis Date  . Allergy     Past Surgical History:  Procedure Laterality Date  . WRIST SURGERY Left     Prior to Admission medications   Medication Sig Start Date End Date Taking? Authorizing Provider  cetirizine (ZYRTEC) 10 MG chewable tablet Chew 10 mg by mouth daily.   Yes [provider]   Allergies: No Known Allergies   Obstetric History: G0P0000  Social History   Social History  . Marital status: Single    Spouse name: N/A  . Number of children: 0  . Years of education: N/A   Occupational History  . manager  [employer]   Social History Main Topics  . Smoking status: Never Smoker  . Smokeless tobacco: Never Used  . Alcohol use No  . Drug use: No  . Sexual activity: Yes    Partners: Male    Birth control/  protection: None   Other Topics Concern  . Not on file   Social History Narrative  . No narrative on file    Family History  Problem Relation Age of Onset  . Hypertension Mother   . Diabetes Mother   . Diabetes Maternal Grandmother     Review of Systems  Constitutional: Negative.   HENT: Negative.   Eyes: Negative.   Respiratory: Negative.   Cardiovascular: Negative.   Gastrointestinal: Negative.   Genitourinary: Negative.   Musculoskeletal: Negative.   Skin: Negative.   Neurological: Negative.   Psychiatric/Behavioral: Negative.      Physical Exam BP 124/70   Ht 5\' 2"  (1.575 m)   Wt 218 lb (98.9 kg)   LMP 09/15/2017   BMI 39.87 kg/m    Physical Exam  Constitutional: She is oriented to person, place, and time. She appears well-developed and well-nourished. No distress.  Genitourinary: Vagina normal and uterus normal. Pelvic exam was performed with patient supine. There is no rash, tenderness or lesion on the right labia. There is no rash, tenderness or lesion on the left labia. Vagina exhibits no lesion. No erythema, tenderness or bleeding in the vagina. No signs of injury around the vagina. Right adnexum does not display mass, does not display tenderness and does not display fullness. Left adnexum does not display mass, does not display tenderness and does not display fullness. Cervix does not exhibit motion tenderness,  lesion, discharge or polyp.   Uterus is mobile. Uterus is not enlarged, tender, exhibiting a mass or irregular (is regular).  Genitourinary Comments: Pelvic and bimanual exam limited by patient's body habitus  HENT:  Head: Normocephalic and atraumatic.  Eyes: EOM are normal. No scleral icterus.  Neck: Normal range of motion. Neck supple. No thyromegaly present.  Cardiovascular: Normal rate and regular rhythm.  Exam reveals no gallop and no friction rub.   No murmur heard. Pulmonary/Chest: Effort normal and breath sounds normal. No respiratory  distress. She has no wheezes. She has no rales.  Abdominal: Soft. Bowel sounds are normal. She exhibits no distension and no mass. There is no tenderness. There is no rebound and no guarding.  Musculoskeletal: Normal range of motion. She exhibits no edema.  Lymphadenopathy:    She has no cervical adenopathy.  Neurological: She is alert and oriented to person, place, and time. No cranial nerve deficit.  Skin: Skin is warm and dry. No erythema.  Psychiatric: She has a normal mood and affect. Her behavior is normal. Judgment normal.    Female chaperone present for pelvic and breast  portions of the physical exam  Results: AUDIT Questionnaire (screen for alcoholism): 1 PHQ-9: 0   Assessment: 26 y.o. G0P0000 female here for routine annual gynecologic examination  Plan: Problem List Items Addressed This Visit    None    Visit Diagnoses    Women's annual routine gynecological examination    -  Primary   Relevant Orders   IGP, CtNg, rfx Aptima HPV ASCU (Completed)   Screening for depression       Screening for alcohol problem       Pap smear for cervical cancer screening       Relevant Orders   IGP, CtNg, rfx Aptima HPV ASCU (Completed)   Screen for STD (sexually transmitted disease)       Relevant Orders   IGP, CtNg, rfx Aptima HPV ASCU (Completed)      Screening: -- Blood pressure screen normal -- Weight screening: obese: discussed management options, including lifestyle, dietary, and exercise. -- Depression screening negative (PHQ-9) -- Nutrition: normal -- cholesterol screening: not due for screening -- osteoporosis screening: not due -- tobacco screening: not using -- alcohol screening: AUDIT questionnaire indicates low-risk usage. -- family history of breast cancer screening: done. not at high risk. -- no evidence of domestic violence or intimate partner violence. -- STD screening: gonorrhea/chlamydia NAAT collected -- pap smear collected per ASCCP guidelines -- flu  vaccine received -- HPV vaccination series: discusse. She would like to consider.  Referral to plastic surgery for breast reduction.   Thomasene Mohair, MD 10/01/2017 11:53 AM

## 2017-10-01 DIAGNOSIS — M545 Low back pain, unspecified: Secondary | ICD-10-CM | POA: Insufficient documentation

## 2017-10-01 LAB — IGP, CTNG, RFX APTIMA HPV ASCU
Chlamydia, Nuc. Acid Amp: NEGATIVE
GONOCOCCUS BY NUCLEIC ACID AMP: NEGATIVE
PAP Smear Comment: 0

## 2017-11-10 ENCOUNTER — Ambulatory Visit
Admission: EM | Admit: 2017-11-10 | Discharge: 2017-11-10 | Disposition: A | Discharge status: Home / Self Care | Payer: 59 | Attending: Emergency Medicine | Admitting: Emergency Medicine

## 2017-11-10 ENCOUNTER — Other Ambulatory Visit: Payer: Self-pay

## 2017-11-10 ENCOUNTER — Encounter: Payer: Self-pay | Admitting: Emergency Medicine

## 2017-11-10 DIAGNOSIS — L2389 Allergic contact dermatitis due to other agents: Secondary | ICD-10-CM

## 2017-11-10 MED ORDER — TRIAMCINOLONE ACETONIDE 0.025 % EX OINT: 1.0000 "application " | TOPICAL_OINTMENT | Freq: Two times a day (BID) | CUTANEOUS | 0 refills | Status: DC

## 2017-11-10 MED ORDER — PREDNISONE 20 MG PO TABS: 40.0000 mg | ORAL_TABLET | Freq: Every day | ORAL | 0 refills | Status: AC

## 2017-11-10 NOTE — ED Provider Notes (Signed)
[facility] ____________________________________________  Time seen: Approximately 8:53 AM  I have reviewed the triage vital signs and the nursing notes.   HISTORY  Chief Complaint Rash   HPI Brenda Reese is a 26 y.o. female presented for evaluation of rash present around her neck that has been present for the last several days.  Patient reports that just prior to rash presentation she had worn a new costume jewelry necklace that was rather large.  Reports he then began to have an itchy rash.  States has been putting some over-the-counter Aquaphor on the area and she felt like the rash had improved, but reports it has since worsened again and remain constant.  Reports the rash is very itchy.  Denies any other changes.  Denies others with similar around her.  Denies changes in foods, medicines, lotions, detergents or other contacts.  Denies associated swelling, pain, difficulty swallowing or breathing. Denies insect bite. Reports otherwise feels well.  Denies history of same.  No other over-the-counter treatments applied or tried.  Denies other aggravating or relieving factors.  Denies chest pain, shortness of breath, abdominal pain, or rash. Denies recent sickness. Denies recent antibiotic use.   Dennison MascotMorrisey, [place], MD: PCP Patient's last menstrual period was 10/27/2017 (approximate).Denies pregnancy.    Past Medical History:  Diagnosis Date  . Allergy     Patient Active Problem List   Diagnosis Date Noted  . Bilateral low back pain 10/01/2017  . Missed period 02/04/2016  . PCOS (polycystic ovarian syndrome) 02/04/2016  . Dietary counseling 07/01/2015  . Obesity 07/01/2015    Past Surgical History:  Procedure Laterality Date  . WRIST SURGERY Left      No current facility-administered medications for this encounter.   Current Outpatient Medications:  .  cetirizine (ZYRTEC) 10 MG chewable tablet, Chew 10 mg by mouth daily., Disp: , Rfl:  .  predniSONE  (DELTASONE) 20 MG tablet, Take 2 tablets (40 mg total) by mouth daily for 3 days., Disp: 6 tablet, Rfl: 0 .  triamcinolone (KENALOG) 0.025 % ointment, Apply 1 application topically 2 (two) times daily. For 7 days as needed, Disp: 30 g, Rfl: 0  Allergies Patient has no known allergies.  Family History  Problem Relation Age of Onset  . Hypertension Mother   . Diabetes Mother   . Diabetes Maternal Grandmother     Social History Social History   Tobacco Use  . Smoking status: Never Smoker  . Smokeless tobacco: Never Used  Substance Use Topics  . Alcohol use: No    Alcohol/week: 0.0 oz  . Drug use: No    Review of Systems Constitutional: No fever/chills ENT: No sore throat. Cardiovascular: Denies chest pain. Respiratory: Denies shortness of breath. Musculoskeletal: Negative for back pain. Skin: Positive for rash.   ____________________________________________   PHYSICAL EXAM:  VITAL SIGNS: ED Triage Vitals  Enc Vitals Group     BP 11/10/17 0814 114/78     Pulse Rate 11/10/17 0814 70     Resp 11/10/17 0814 16     Temp 11/10/17 0814 97.7 F (36.5 C)     Temp Source 11/10/17 0814 Oral     SpO2 11/10/17 0814 99 %     Weight 11/10/17 0811 218 lb (98.9 kg)     Height 11/10/17 0811 5\' 2"  (1.575 m)     Head Circumference --      Peak Flow --      Pain Score 11/10/17 0811 0     Pain Loc --  Pain Edu? --      Excl. in GC? --     Constitutional: Alert and oriented. Well appearing and in no acute distress. Eyes: Conjunctivae are normal.  ENT      Head: Normocephalic and atraumatic.      Nose: No congestion/rhinnorhea.      Mouth/Throat: Mucous membranes are moist. Oropharynx non-erythematous. No  Edema noted. Neck: No stridor. Supple without meningismus.  Hematological/Lymphatic/Immunilogical: No cervical lymphadenopathy. Cardiovascular: Normal rate, regular rhythm. Grossly normal heart sounds.  Good peripheral circulation. Respiratory: Normal respiratory effort  without tachypnea nor retractions. Breath sounds are clear and equal bilaterally. No wheezes, rales, rhonchi. Musculoskeletal:  Steady gait. Neurologic:  Normal speech and language.Speech is normal. No gait instability.  Skin:  Skin is warm, dry. Except: Near circumferential, excluding posterior segment of neck, mildly erythematous pruritic papular rash present circular around neck. No rash to neck or chest or back.  Nontender, no edema, no drainage, no surrounding erythema. Psychiatric: Mood and affect are normal. Speech and behavior are normal. Patient exhibits appropriate insight and judgment   ___________________________________________   LABS (all labs ordered are listed, but only abnormal results are displayed)  Labs Reviewed - No data to display ____________________________________________  PROCEDURES Procedures    INITIAL IMPRESSION / ASSESSMENT AND PLAN / ED COURSE  Pertinent labs & imaging results that were available during my care of the patient were reviewed by me and considered in my medical decision making (see chart for details).  Well-appearing patient.  No acute distress.  Presenting for evaluation of rash around neck.  Suspect contact dermatitis from recent necklace.  Counseled to avoid trigger.  As rash has been persistent, we will treat patient with topical triamcinolone.  Will treat patient with oral 3-day course of prednisone, and encouraged over-the-counter antihistamines as needed for itching.  Encouraged supportive care, monitoring very closely.Discussed indication, risks and benefits of medications with patient.  Discussed follow up with Primary care physician this week as needed. Discussed follow up and return parameters including no resolution or any worsening concerns. Patient verbalized understanding and agreed to plan.   ____________________________________________   FINAL CLINICAL IMPRESSION(S) / ED DIAGNOSES  Final diagnoses:  Allergic contact  dermatitis due to other agents     ED Discharge Orders        Ordered    predniSONE (DELTASONE) 20 MG tablet  Daily     11/10/17 0836    triamcinolone (KENALOG) 0.025 % ointment  2 times daily     11/10/17 [id number]       Note: This dictation was prepared with Dragon dictation along with smaller phrase technology. Any transcriptional errors that result from this process are unintentional.         Renford DillsMiller, Maeva Dant, NP 11/10/17 1145

## 2017-11-10 NOTE — Discharge Instructions (Signed)
Take medication as prescribed. Take daily Claritin or Zyrtec, Benadryl at night as needed. Drink plenty of fluids. Avoid trigger.   Follow up with your primary care physician this week as needed. Return to Urgent care for new or worsening concerns.

## 2017-11-10 NOTE — ED Triage Notes (Signed)
Patient states that she was wearing some costume jewelry couple days ago and developed an itchy rash around her neck.

## 2017-11-11 DIAGNOSIS — H5213 Myopia, bilateral: Secondary | ICD-10-CM | POA: Diagnosis not present

## 2018-05-12 ENCOUNTER — Encounter: Payer: Self-pay | Admitting: Emergency Medicine

## 2018-05-12 ENCOUNTER — Ambulatory Visit
Admission: EM | Admit: 2018-05-12 | Discharge: 2018-05-12 | Disposition: A | Discharge status: Home / Self Care | Payer: 59 | Attending: Family Medicine | Admitting: Family Medicine

## 2018-05-12 ENCOUNTER — Other Ambulatory Visit: Payer: Self-pay

## 2018-05-12 DIAGNOSIS — N938 Other specified abnormal uterine and vaginal bleeding: Secondary | ICD-10-CM | POA: Diagnosis not present

## 2018-05-12 LAB — BASIC METABOLIC PANEL
ANION GAP: 11 (ref 5–15)
BUN: 12 mg/dL (ref 6–20)
CALCIUM: 9.2 mg/dL (ref 8.9–10.3)
CO2: 20 mmol/L — ABNORMAL LOW (ref 22–32)
CREATININE: 0.68 mg/dL (ref 0.44–1.00)
Chloride: 108 mmol/L (ref 101–111)
Glucose, Bld: 95 mg/dL (ref 65–99)
Potassium: 4 mmol/L (ref 3.5–5.1)
SODIUM: 139 mmol/L (ref 135–145)

## 2018-05-12 LAB — CBC WITH DIFFERENTIAL/PLATELET
BASOS ABS: 0.1 10*3/uL (ref 0–0.1)
BASOS PCT: 1 %
EOS ABS: 0.1 10*3/uL (ref 0–0.7)
Eosinophils Relative: 1 %
HEMATOCRIT: 37.1 % (ref 35.0–47.0)
Hemoglobin: 12.9 g/dL (ref 12.0–16.0)
Lymphocytes Relative: 30 %
Lymphs Abs: 2.9 10*3/uL (ref 1.0–3.6)
MCH: 28.1 pg (ref 26.0–34.0)
MCHC: 34.7 g/dL (ref 32.0–36.0)
MCV: 80.9 fL (ref 80.0–100.0)
MONO ABS: 0.6 10*3/uL (ref 0.2–0.9)
MONOS PCT: 6 %
NEUTROS ABS: 6.1 10*3/uL (ref 1.4–6.5)
Neutrophils Relative %: 62 %
PLATELETS: 358 10*3/uL (ref 150–440)
RBC: 4.59 MIL/uL (ref 3.80–5.20)
RDW: 13.7 % (ref 11.5–14.5)
WBC: 9.7 10*3/uL (ref 3.6–11.0)

## 2018-05-12 NOTE — ED Provider Notes (Signed)
[facility] ____________________________________________  Time seen: Approximately 9:09 AM  I have reviewed the triage vital signs and the nursing notes.   HISTORY  Chief Complaint Vaginal Bleeding  HPI Brenda Reese is a 27 y.o. female past medical history of PCOS presenting for abnormal vaginal bleeding.  Patient states she has always had irregular cycles in the past, stating she was put on birth control at an early age due to irregular and frequent cycles.  States she then stopped oral contraceptives approximately 3 years ago and since then she has had near normal menstrual's.  States that she does typically have a menstrual cycle once a month that last for only 3 days and is described as a very light.  Reports last Monday she had onset of more like than normal vaginal bleeding that was consistent with normal menstrual start time, and reports lasted for 3 days and then completely stopped.  States this past Tuesday she started with vaginal bleeding again.  Patient states this time she has had more vaginal bleeding.  States she has had lower abdominal cramping consistent with menstrual cycles and diffuse bilaterally, and states that the bleeding has been darker with intermittent clots. Denies pain unilaterally. Denies vaginal pain. Patient states that yesterday she was changing her pad every hour, and further states that she was setting an alarm on her phone to change her pad every hour.  Patient states that she has not been sexually active since August 2018 when she and her boyfriend broke up, and states absolutely no sexual activity in the last 10 months.  Patient declines any chance of pregnancy and and declines pregnancy testing.  Patient states that overall she feels well, but just wanted to "make sure "that she was ok.   Denies other abdominal discomfort.  States has some chronic low back pain due to larger breasts, denies any acute change in back pain.  Denies dysuria,  atypical vaginal discharge, vomiting, diarrhea.  Reports continues with normal urinary and bowel habits.  No syncope or near syncope.  Continues to eat and drink well.  Denies other aggravating or relieving factors. Denies recent sickness. Denies recent antibiotic use.   Dennison MascotMorrisey, [place], MD: PCP Patient's last menstrual period was 05/09/2018 (exact date).Denies pregnancy. Declines pregnancy testing.  Follows with an OBGYN yearly.    Past Medical History:  Diagnosis Date  . Allergy     Patient Active Problem List   Diagnosis Date Noted  . Bilateral low back pain 10/01/2017  . Missed period 02/04/2016  . PCOS (polycystic ovarian syndrome) 02/04/2016  . Dietary counseling 07/01/2015  . Obesity 07/01/2015    Past Surgical History:  Procedure Laterality Date  . WRIST SURGERY Left      No current facility-administered medications for this encounter.  No current outpatient medications on file.  Allergies Patient has no known allergies.  Family History  Problem Relation Age of Onset  . Hypertension Mother   . Diabetes Mother   . Diabetes Maternal Grandmother     Social History Social History   Tobacco Use  . Smoking status: Never Smoker  . Smokeless tobacco: Never Used  Substance Use Topics  . Alcohol use: No    Alcohol/week: 0.0 oz  . Drug use: No    Review of Systems Constitutional: No fever Cardiovascular: Denies chest pain. Respiratory: Denies shortness of breath. Gastrointestinal: As above. No nausea, no vomiting.  No diarrhea.  No constipation. Genitourinary: Negative for dysuria. Musculoskeletal: Negative for back pain. Skin: Negative for  rash.   ____________________________________________   PHYSICAL EXAM:  VITAL SIGNS: ED Triage Vitals  Enc Vitals Group     BP 05/12/18 0845 123/86     Pulse Rate 05/12/18 0845 76     Resp 05/12/18 0845 14     Temp 05/12/18 0845 98.1 F (36.7 C)     Temp Source 05/12/18 0845 Oral     SpO2 05/12/18 0845 100 %      Weight 05/12/18 0842 206 lb (93.4 kg)     Height 05/12/18 0842 5\' 2"  (1.575 m)     Head Circumference --      Peak Flow --      Pain Score 05/12/18 0842 7     Pain Loc --      Pain Edu? --      Excl. in GC? --     Constitutional: Alert and oriented. Well appearing and in no acute distress. ENT      Head: Normocephalic and atraumatic.      Mouth/Throat: Mucous membranes are moist. Cardiovascular: Normal rate, regular rhythm. Grossly normal heart sounds.  Good peripheral circulation. Respiratory: Normal respiratory effort without tachypnea nor retractions. Breath sounds are clear and equal bilaterally. No wheezes, rales, rhonchi. Gastrointestinal: Soft and nontender. No distention.No CVA tenderness. Musculoskeletal:  No midline cervical, thoracic or lumbar tenderness to palpation.  Neurologic:  Normal speech and language.Speech is normal. No gait instability.  Skin:  Skin is warm, dry. Psychiatric: Mood and affect are normal. Speech and behavior are normal. Patient exhibits appropriate insight and judgment   ___________________________________________   LABS (all labs ordered are listed, but only abnormal results are displayed)  Labs Reviewed  BASIC METABOLIC PANEL - Abnormal; Notable for the following components:      Result Value   CO2 20 (*)    All other components within normal limits  CBC WITH DIFFERENTIAL/PLATELET     PROCEDURES Procedures     INITIAL IMPRESSION / ASSESSMENT AND PLAN / ED COURSE  Pertinent labs & imaging results that were available during my care of the patient were reviewed by me and considered in my medical decision making (see chart for details).  Well-appearing patient.  No acute distress.  Patient with history of PCOS and in the past with abnormal uterine bleeding.  Patient presenting for having second menstrual in 1 month with increased bleeding that is not typical for her. Patient denies any other complaints. Diffuse abdominal bilateral  cramping sensation, denies any other pain. Discussed in detail with patient at this time will recommend supportive care and monitoring, prior to initiating medication.  Will evaluate CBC and bmp .Patient declined pregnancy testing, and again stated no chance of current pregnancy. No history of anemia. Labs unremarkable. Recommend close follow-up for continued bleeding, and further discussed strict return parameters including pain, atypical symptoms, continued complaints or fever.  Discussed follow up with her OBGYN this week  Discussed follow up and return parameters including no resolution or any worsening concerns. Patient verbalized understanding and agreed to plan.   ____________________________________________   FINAL CLINICAL IMPRESSION(S) / ED DIAGNOSES  Final diagnoses:  Dysfunctional uterine bleeding     ED Discharge Orders    None       Note: This dictation was prepared with Dragon dictation along with smaller phrase technology. Any transcriptional errors that result from this process are unintentional.          Renford Dills, NP 05/12/18 0945

## 2018-05-12 NOTE — ED Triage Notes (Signed)
Patient c/o vaginal bleeding and cramping that started 3 days ago.  Patient reports having irregular periods.

## 2018-05-12 NOTE — Discharge Instructions (Addendum)
Rest. Drink plenty of fluids. Call to schedule follow up with your OBGYN this week as discussed.   Follow up with your primary care physician this week as needed. Return to Urgent care for new or worsening concerns.

## 2019-06-15 ENCOUNTER — Other Ambulatory Visit: Payer: Self-pay

## 2019-06-15 ENCOUNTER — Emergency Department
Admission: EM | Admit: 2019-06-15 | Discharge: 2019-06-15 | Disposition: A | Discharge status: Home / Self Care | Payer: No Typology Code available for payment source | Attending: Emergency Medicine | Admitting: Emergency Medicine

## 2019-06-15 DIAGNOSIS — Y990 Civilian activity done for income or pay: Secondary | ICD-10-CM | POA: Insufficient documentation

## 2019-06-15 DIAGNOSIS — Y939 Activity, unspecified: Secondary | ICD-10-CM | POA: Diagnosis not present

## 2019-06-15 DIAGNOSIS — S46911A Strain of unspecified muscle, fascia and tendon at shoulder and upper arm level, right arm, initial encounter: Secondary | ICD-10-CM | POA: Diagnosis not present

## 2019-06-15 DIAGNOSIS — X500XXA Overexertion from strenuous movement or load, initial encounter: Secondary | ICD-10-CM | POA: Diagnosis not present

## 2019-06-15 DIAGNOSIS — S4991XA Unspecified injury of right shoulder and upper arm, initial encounter: Secondary | ICD-10-CM | POA: Diagnosis present

## 2019-06-15 DIAGNOSIS — S161XXA Strain of muscle, fascia and tendon at neck level, initial encounter: Secondary | ICD-10-CM | POA: Diagnosis not present

## 2019-06-15 DIAGNOSIS — Y929 Unspecified place or not applicable: Secondary | ICD-10-CM | POA: Insufficient documentation

## 2019-06-15 LAB — POCT PREGNANCY, URINE: Preg Test, Ur: NEGATIVE

## 2019-06-15 MED ORDER — CYCLOBENZAPRINE HCL 10 MG PO TABS
10.0000 mg | ORAL_TABLET | Freq: Once | ORAL | Status: AC
  Administered 2019-06-15: 10 mg via ORAL
  Filled 2019-06-15: qty 1

## 2019-06-15 MED ORDER — CYCLOBENZAPRINE HCL 5 MG PO TABS: 5.0000 mg | ORAL_TABLET | Freq: Three times a day (TID) | ORAL | 0 refills | Status: DC | PRN

## 2019-06-15 MED ORDER — ACETAMINOPHEN 500 MG PO TABS
1000.0000 mg | ORAL_TABLET | Freq: Once | ORAL | Status: AC
  Administered 2019-06-15: 1000 mg via ORAL
  Filled 2019-06-15: qty 2

## 2019-06-15 NOTE — ED Provider Notes (Signed)
[facility] EMERGENCY DEPARTMENT Provider Note   CSN: [id number] Arrival date & time: 06/15/19  1944     History   Chief Complaint Chief Complaint  Patient presents with  . Shoulder Pain    HPI Brenda Reese is a 28 y.o. female presents to the emergency department for evaluation of right-sided neck pain.  Symptoms began last night.  She has tightness in the right paravertebral muscles cervical spine especially with left cervical rotation.  She denies any numbness tingling radicular symptoms.  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  She does not take any medications for symptoms.  Pain is 6 out of 10.  She denies any limitation in regards to range of motion of the right shoulder.  She denies any chest pain, shortness of breath or abdominal pain.     HPI  Past Medical History:  Diagnosis Date  . Allergy     Patient Active Problem List   Diagnosis Date Noted  . Bilateral low back pain 10/01/2017  . Missed period 02/04/2016  . PCOS (polycystic ovarian syndrome) 02/04/2016  . Dietary counseling 07/01/2015  . Obesity 07/01/2015    Past Surgical History:  Procedure Laterality Date  . WRIST SURGERY Left      OB History    Gravida  0   Para  0   Term  0   Preterm  0   AB  0   Living  0     SAB  0   TAB  0   Ectopic  0   Multiple  0   Live Births  0            Home Medications    Prior to Admission medications   Medication Sig Start Date End Date Taking? Authorizing Provider  cyclobenzaprine (FLEXERIL) 5 MG tablet Take 1-2 tablets (5-10 mg total) by mouth 3 (three) times daily as needed for muscle spasms. 06/15/19   Duanne Guess, PA-C    Family History Family History  Problem Relation Age of Onset  . Hypertension Mother   . Diabetes Mother   . Diabetes Maternal Grandmother     Social History  Social History   Tobacco Use  . Smoking status: Never Smoker  . Smokeless tobacco: Never Used  Substance Use Topics  . Alcohol use: Yes    Alcohol/week: 0.0 standard drinks  . Drug use: No     Allergies   Patient has no known allergies.   Review of Systems Review of Systems  Constitutional: Negative for fever.  Respiratory: Negative for shortness of breath.   Cardiovascular: Negative for chest pain.  Musculoskeletal: Positive for myalgias, neck pain and neck stiffness. Negative for arthralgias.  Skin: Negative for rash and wound.  Neurological: Negative for dizziness, weakness, numbness and headaches.     Physical Exam Updated Vital Signs BP 134/88 (BP Location: Left Arm)   Pulse 78   Temp 98.6 F (37 C) (Oral)   Resp 18   Ht 5\' 2"  (1.575 m)   Wt 104.3 kg   SpO2 96%   BMI 42.07 kg/m   Physical Exam Constitutional:      Appearance: She is well-developed.  HENT:     Head: Normocephalic and atraumatic.  Eyes:     Conjunctiva/sclera: Conjunctivae normal.  Neck:     Musculoskeletal: Normal range of motion.  Cardiovascular:     Rate and Rhythm: Normal rate.  Pulmonary:     Effort: Pulmonary effort is normal. No respiratory distress.  Musculoskeletal:     Comments: Cervical Spine: Examination of the cervical spine reveals no bony abnormality, no edema, and no ecchymosis.  There is no step-off.  The patient has full active and passive range of motion of the cervical spine with flexion, extension, and right and left bend with rotation.  There is no crepitus with range of motion exercises.  The patient is non-tender along the spinous process to palpation.  Right paravertebral muscle tenderness along the trapezius with mild tightness and spasm noted.  There is no parascapular discomfort.  The patient has a negative axial compression test.  The patient has a negative Spurling test.  Right upper Extremity: Examination of the right shoulder and arm showed no bony  abnormality or edema.  The patient has normal active and passive motion with abduction, flexion, internal rotation, and external rotation.  The patient has no tenderness with motion.  The patient has a negative Hawkins test and a negative impingement test.  The patient has a negative drop arm test.  The patient is non-tender along the deltoid muscle.  There is no subacromial space tenderness with no AC joint tenderness.  The patient has no instability of the shoulder with anterior-posterior motion.  There is a negative sulcus sign.  The rotator cuff muscle strength is 5/5 with supraspinatus, 5/5 with internal rotation, and 5/5 with external rotation.  There is no crepitus with range of motion activities.     Skin:    General: Skin is warm.     Findings: No rash.  Neurological:     Mental Status: She is alert and oriented to person, place, and time.  Psychiatric:        Behavior: Behavior normal.        Thought Content: Thought content normal.      ED Treatments / Results  Labs (all labs ordered are listed, but only abnormal results are displayed) Labs Reviewed  POC URINE PREG, ED  POCT PREGNANCY, URINE    EKG None  Radiology No results found.  Procedures Procedures (including critical care time)  Medications Ordered in ED Medications  acetaminophen (TYLENOL) tablet 1,000 mg (has no administration in time range)  cyclobenzaprine (FLEXERIL) tablet 10 mg (has no administration in time range)     Initial Impression / Assessment and Plan / ED Course  I have reviewed the triage vital signs and the nursing notes.  Pertinent labs & imaging results that were available during my care of the patient were reviewed by me and considered in my medical decision making (see chart for details).        28 year old female with right paravertebral muscle tightness and spasming along the paravertebral muscles of cervical spine.  No spinous process tenderness.  No trauma or injury.  No  radicular symptoms or neurological deficits.  Vital signs stable, afebrile.  Discussed need to return to ED.  Recommend Tylenol and Flexeril.  Follow-up PCP if no improvement 1 week.  Final Clinical Impressions(s) / ED Diagnoses   Final diagnoses:  Strain of cervical portion of right trapezius muscle    ED Discharge Orders         Ordered    cyclobenzaprine (FLEXERIL) 5 MG tablet  3 times daily PRN     06/15/19 2249           Evon SlackGaines, Jerrard Bradburn C, PA-C 06/15/19  2253    Shaune PollackIsaacs, Cameron, MD 06/18/19 0530

## 2019-06-15 NOTE — ED Notes (Signed)
Per WC Profile, UDS is not required. Profile attached to pt chart.

## 2019-06-15 NOTE — Discharge Instructions (Signed)
Please work on gentle neck range of motion exercises.  You may try light massage.  Take Tylenol and Flexeril as needed.  If no improvement 1 week follow-up with PCP to discuss physical therapy.  Return to the ER for any fevers, pain, weakness, numbness, worsening symptoms or urgent changes in health.

## 2019-06-15 NOTE — ED Triage Notes (Signed)
Pt was pulling down boxes at work and her right shoulder and neck began to hurt. Pt describes a pulling or tightness when she attempts to turn her neck, she has to turn her entire torso.

## 2019-08-22 ENCOUNTER — Other Ambulatory Visit (HOSPITAL_COMMUNITY)
Admission: RE | Admit: 2019-08-22 | Discharge: 2019-08-22 | Disposition: A | Discharge status: Home / Self Care | Payer: Managed Care, Other (non HMO) | Source: Ambulatory Visit | Attending: Obstetrics and Gynecology | Admitting: Obstetrics and Gynecology

## 2019-08-22 ENCOUNTER — Ambulatory Visit (INDEPENDENT_AMBULATORY_CARE_PROVIDER_SITE_OTHER): Payer: Managed Care, Other (non HMO) | Admitting: Obstetrics and Gynecology

## 2019-08-22 ENCOUNTER — Other Ambulatory Visit: Payer: Self-pay | Admitting: Obstetrics and Gynecology

## 2019-08-22 ENCOUNTER — Other Ambulatory Visit: Payer: Self-pay

## 2019-08-22 ENCOUNTER — Other Ambulatory Visit (INDEPENDENT_AMBULATORY_CARE_PROVIDER_SITE_OTHER): Payer: Managed Care, Other (non HMO)

## 2019-08-22 ENCOUNTER — Ambulatory Visit: Payer: 59 | Admitting: Obstetrics and Gynecology

## 2019-08-22 ENCOUNTER — Encounter: Payer: Self-pay | Admitting: Obstetrics and Gynecology

## 2019-08-22 VITALS — BP 110/80 | Ht 62.0 in | Wt 228.0 lb

## 2019-08-22 DIAGNOSIS — R102 Pelvic and perineal pain: Secondary | ICD-10-CM | POA: Diagnosis not present

## 2019-08-22 DIAGNOSIS — N938 Other specified abnormal uterine and vaginal bleeding: Secondary | ICD-10-CM

## 2019-08-22 DIAGNOSIS — Z113 Encounter for screening for infections with a predominantly sexual mode of transmission: Secondary | ICD-10-CM | POA: Insufficient documentation

## 2019-08-22 DIAGNOSIS — E282 Polycystic ovarian syndrome: Secondary | ICD-10-CM

## 2019-08-22 LAB — POCT URINE PREGNANCY: Preg Test, Ur: NEGATIVE

## 2019-08-22 NOTE — Patient Instructions (Signed)
I value your feedback and entrusting us with your care. If you get a [facility] patient survey, I would appreciate you taking the time to let us know about your experience today. Thank you! 

## 2019-08-22 NOTE — Progress Notes (Signed)
[name], MD   Chief Complaint  Patient presents with  . Metrorrhagia    a lot of lower stomach pain, on and off bleeding since 07/03/19, light and heavy bleeding    HPI:      [name] is a 28 y.o. G0P0000 who LMP was No LMP recorded. (Menstrual status: Irregular Periods)., presents today for NP> 3 yrs issues with irreg bleeding since 7/20. Hx of PCOS, controlled with OCPs in past, but not on any BC recently. Menses usually infrequent, last 1 1/2 days, light flow, no dysmen. Had menses 06/04/19 for 1 1/2 days and then started bleeding again 07/03/19. Bleeding was light, almost daily, and off and on throughout day; also with bleeding during sex. Has noted pelvic tenderness to touch and cramping. No meds taken. Bleeding stopped about 1 1/2 wks ago. No GI, vag or urin sx. No LBP, fevers.  Pt is sex active, no new partners.  Last pap 2018.  No hx of HTN, DVTs, migraines with aura. Wants to conceive in future but not now.  Past Medical History:  Diagnosis Date  . Allergy     Past Surgical History:  Procedure Laterality Date  . WRIST SURGERY Left     Family History  Problem Relation Age of Onset  . Hypertension Mother   . Diabetes Mother   . Diabetes Maternal Grandmother     Social History   Socioeconomic History  . Marital status: Single    Spouse name: Not on file  . Number of children: 0  . Years of education: Not on file  . Highest education level: Not on file  Occupational History  . Occupation: Clinical cytogeneticist: [employer]  Social Needs  . Financial resource strain: Not on file  . Food insecurity    Worry: Not on file    Inability: Not on file  . Transportation needs    Medical: Not on file    Non-medical: Not on file  Tobacco Use  . Smoking status: Never Smoker  . Smokeless tobacco: Never Used  Substance and Sexual Activity  . Alcohol use: Yes    Alcohol/week: 0.0 standard drinks  . Drug use: No  . Sexual activity: Yes   Partners: Male    Birth control/protection: None  Lifestyle  . Physical activity    Days per week: Not on file    Minutes per session: Not on file  . Stress: Not on file  Relationships  . Social Musician on phone: Not on file    Gets together: Not on file    Attends religious service: Not on file    Active member of club or organization: Not on file    Attends meetings of clubs or organizations: Not on file    Relationship status: Not on file  . Intimate partner violence    Fear of current or ex partner: Not on file    Emotionally abused: Not on file    Physically abused: Not on file    Forced sexual activity: Not on file  Other Topics Concern  . Not on file  Social History Narrative  . Not on file    Outpatient Medications Prior to Visit  Medication Sig Dispense Refill  . cyclobenzaprine (FLEXERIL) 5 MG tablet Take 1-2 tablets (5-10 mg total) by mouth 3 (three) times daily as needed for muscle spasms. 20 tablet 0   No facility-administered medications prior to visit.  ROS:  Review of Systems  Constitutional: Negative for fatigue, fever and unexpected weight change.  Respiratory: Negative for cough, shortness of breath and wheezing.   Cardiovascular: Negative for chest pain, palpitations and leg swelling.  Gastrointestinal: Negative for blood in stool, constipation, diarrhea, nausea and vomiting.  Endocrine: Negative for cold intolerance, heat intolerance and polyuria.  Genitourinary: Positive for menstrual problem and pelvic pain. Negative for dyspareunia, dysuria, flank pain, frequency, genital sores, hematuria, urgency, vaginal bleeding, vaginal discharge and vaginal pain.  Musculoskeletal: Negative for back pain, joint swelling and myalgias.  Skin: Negative for rash.  Neurological: Negative for dizziness, syncope, light-headedness, numbness and headaches.  Hematological: Negative for adenopathy.  Psychiatric/Behavioral: Negative for agitation,  confusion, sleep disturbance and suicidal ideas. The patient is not nervous/anxious.     OBJECTIVE:   Vitals:  BP 110/80   Ht 5\' 2"  (1.575 m)   Wt 228 lb (103.4 kg)   BMI 41.70 kg/m   Physical Exam Vitals signs reviewed.  Constitutional:      Appearance: She is well-developed.  Neck:     Musculoskeletal: Normal range of motion.  Pulmonary:     Effort: Pulmonary effort is normal.  Abdominal:     Palpations: Abdomen is soft.     Tenderness: There is abdominal tenderness in the right lower quadrant, suprapubic area and left lower quadrant. There is no guarding or rebound.  Genitourinary:    General: Normal vulva.     Pubic Area: No rash.      Labia:        Right: No rash, tenderness or lesion.        Left: No rash, tenderness or lesion.      Vagina: Normal. No vaginal discharge, erythema or tenderness.     Cervix: No cervical motion tenderness.     Uterus: Normal. Tender. Not enlarged.      Adnexa:        Right: Tenderness present. No mass.         Left: Tenderness present. No mass.    Musculoskeletal: Normal range of motion.  Skin:    General: Skin is warm and dry.  Neurological:     General: No focal deficit present.     Mental Status: She is alert and oriented to person, place, and time.  Psychiatric:        Mood and Affect: Mood normal.        Behavior: Behavior normal.        Thought Content: Thought content normal.        Judgment: Judgment normal.     Results: Results for orders placed or performed in visit on 08/22/19 (from the past 24 hour(s))  POCT urine pregnancy     Status: Normal   Collection Time: 08/22/19  3:48 PM  Result Value Ref Range   Preg Test, Ur Negative Negative   ULTRASOUND REPORT  Location: [facility]  Date of Service: 08/22/2019     Indications:Abnormal Uterine Bleeding Findings:  The uterus is anteflexed and measures 8.3 x 4.3 x 3.7cm. Echo texture is homogenous without evidence of focal masses.  The Endometrium is  heterogeneous andmeasures 9.6 mm. No obvious polyp seen.   Right Ovary measures 3.4 x 1.9 x 2.0 cm. It is normal in appearance. Left Ovary measures 2.2 x 1.7 x 1.8 cm. It is normal in appearance. Survey of the adnexa demonstrates no adnexal masses. There is no free fluid in the cul de sac.  Impression: 1. Heterogeneous endometrium, otherwise normal  gyn ultrasound.   Recommendations: 1.Clinical correlation with the patient's History and Physical Exam.   Darlina GuysAbby M Clarke, RT   Assessment/Plan: DUB (dysfunctional uterine bleeding) - Plan: POCT urine pregnancy, US PELVIS TRANSVAGINAL NON-OB (TV ONLY); Sx resolved, neg GYN u/s. Most likely due to PCOS. Discussed cycle control options.   Pelvic pain - Plan: US PELVIS TRANSVAGINAL NON-OB (TV ONLY); Sx improved.   Screening for STD (sexually transmitted disease) - Plan: Cervicovaginal ancillary only--will call with results. If neg, question etiology of pelvic pain. F/u prn.   PCOS (polycystic ovarian syndrome)--discussed options of cycle control including provera Q90 days, OCPs, IUD. Pt to consider and we'll discuss further when I call with STD results.     Return if symptoms worsen or fail to improve.  Alicia B. Copland, PA-C 08/22/2019 4:12 PM

## 2019-08-24 LAB — CERVICOVAGINAL ANCILLARY ONLY
Chlamydia: NEGATIVE
Neisseria Gonorrhea: NEGATIVE

## 2019-08-24 NOTE — Progress Notes (Signed)
Pls let pt know STD testing neg. THx

## 2019-08-25 NOTE — Progress Notes (Signed)
Pt aware.

## 2019-11-04 ENCOUNTER — Ambulatory Visit (INDEPENDENT_AMBULATORY_CARE_PROVIDER_SITE_OTHER)
Admission: EM | Admit: 2019-11-04 | Discharge: 2019-11-04 | Disposition: A | Discharge status: Home / Self Care | Payer: Managed Care, Other (non HMO) | Source: Home / Self Care | Attending: Emergency Medicine | Admitting: Emergency Medicine

## 2019-11-04 ENCOUNTER — Other Ambulatory Visit: Payer: Self-pay

## 2019-11-04 ENCOUNTER — Emergency Department: Payer: Managed Care, Other (non HMO)

## 2019-11-04 ENCOUNTER — Encounter: Payer: Self-pay | Admitting: Emergency Medicine

## 2019-11-04 ENCOUNTER — Emergency Department
Admission: EM | Admit: 2019-11-04 | Discharge: 2019-11-04 | Disposition: A | Discharge status: Home / Self Care | Payer: Managed Care, Other (non HMO) | Attending: Emergency Medicine | Admitting: Emergency Medicine

## 2019-11-04 DIAGNOSIS — R1030 Lower abdominal pain, unspecified: Secondary | ICD-10-CM | POA: Diagnosis not present

## 2019-11-04 DIAGNOSIS — M545 Low back pain: Secondary | ICD-10-CM

## 2019-11-04 DIAGNOSIS — Z3202 Encounter for pregnancy test, result negative: Secondary | ICD-10-CM

## 2019-11-04 DIAGNOSIS — R109 Unspecified abdominal pain: Secondary | ICD-10-CM | POA: Diagnosis not present

## 2019-11-04 HISTORY — DX: Polycystic ovarian syndrome: E28.2

## 2019-11-04 LAB — COMPREHENSIVE METABOLIC PANEL
ALT: 19 U/L (ref 0–44)
AST: 18 U/L (ref 15–41)
Albumin: 4.2 g/dL (ref 3.5–5.0)
Alkaline Phosphatase: 81 U/L (ref 38–126)
Anion gap: 10 (ref 5–15)
BUN: 14 mg/dL (ref 6–20)
CO2: 23 mmol/L (ref 22–32)
Calcium: 9.2 mg/dL (ref 8.9–10.3)
Chloride: 108 mmol/L (ref 98–111)
Creatinine, Ser: 0.79 mg/dL (ref 0.44–1.00)
GFR calc Af Amer: >60 mL/min (ref >60)
GFR calc non Af Amer: >60 mL/min (ref >60)
Glucose, Bld: 83 mg/dL (ref 70–99)
Potassium: 3.9 mmol/L (ref 3.5–5.1)
Sodium: 141 mmol/L (ref 135–145)
Total Bilirubin: 0.7 mg/dL (ref 0.3–1.2)
Total Protein: 7.9 g/dL (ref 6.5–8.1)

## 2019-11-04 LAB — URINALYSIS, COMPLETE (UACMP) WITH MICROSCOPIC
Bacteria, UA: NONE SEEN
Bilirubin Urine: NEGATIVE
Bilirubin Urine: NEGATIVE
Glucose, UA: NEGATIVE mg/dL
Glucose, UA: NEGATIVE mg/dL
Hgb urine dipstick: NEGATIVE
Hgb urine dipstick: NEGATIVE
Ketones, ur: NEGATIVE mg/dL
Ketones, ur: NEGATIVE mg/dL
Leukocytes,Ua: NEGATIVE
Leukocytes,Ua: NEGATIVE
Nitrite: NEGATIVE
Nitrite: NEGATIVE
Protein, ur: NEGATIVE mg/dL
Protein, ur: NEGATIVE mg/dL
Specific Gravity, Urine: >1.03 — ABNORMAL HIGH (ref 1.005–1.030)
Specific Gravity, Urine: 1.023 (ref 1.005–1.030)
pH: 6 (ref 5.0–8.0)
pH: 7 (ref 5.0–8.0)

## 2019-11-04 LAB — CBC
HCT: 37.4 % (ref 36.0–46.0)
Hemoglobin: 13.5 g/dL (ref 12.0–15.0)
MCH: 26.7 pg (ref 26.0–34.0)
MCHC: 36.1 g/dL — ABNORMAL HIGH (ref 30.0–36.0)
MCV: 73.9 fL — ABNORMAL LOW (ref 80.0–100.0)
Platelets: 384 10*3/uL (ref 150–400)
RBC: 5.06 MIL/uL (ref 3.87–5.11)
RDW: 14 % (ref 11.5–15.5)
WBC: 12.2 10*3/uL — ABNORMAL HIGH (ref 4.0–10.5)
nRBC: 0 % (ref 0.0–0.2)

## 2019-11-04 LAB — POCT PREGNANCY, URINE: Preg Test, Ur: NEGATIVE

## 2019-11-04 LAB — PREGNANCY, URINE: Preg Test, Ur: NEGATIVE

## 2019-11-04 LAB — LIPASE, BLOOD: Lipase: 25 U/L (ref 11–51)

## 2019-11-04 MED ORDER — IOHEXOL 300 MG/ML  SOLN
100.0000 mL | Freq: Once | INTRAMUSCULAR | Status: AC | PRN
  Administered 2019-11-04: 100 mL via INTRAVENOUS

## 2019-11-04 MED ORDER — TRAMADOL HCL 50 MG PO TABS: 50.0000 mg | ORAL_TABLET | Freq: Four times a day (QID) | ORAL | 0 refills | Status: DC | PRN

## 2019-11-04 MED ORDER — SODIUM CHLORIDE 0.9% FLUSH: 3.0000 mL | Freq: Once | INTRAVENOUS | Status: DC

## 2019-11-04 NOTE — ED Provider Notes (Signed)
HPI  SUBJECTIVE:  Brenda Reese is a 28 y.o. female who presents with constant sore, stabbing low midline abdominal pain that radiates to bilateral lower quadrants.  She states that it goes into her bilateral lower back.  She states the pain is worse on the right than the left.  No nausea, vomiting, fevers, abdominal distention.  Had a normal bowel movement this morning.  No dysuria, urgency, frequency, cloudy or odorous urine, hematuria.  She reports vaginal spotting 4-5 days ago, none since.  No vaginal odor, pain, rash, itching, discharge.  She is in a long-term monogamous relationship with a female who is asymptomatic.  STDs not a concern today.  She denies anorexia.  Has never had symptoms like this before.  She tried 400 mg of ibuprofen last night with very brief partial relief.  Symptoms better with lying down, worse with sitting up, walking.  Is not associated with movement, eating, drinking, urination, defecation.  She has a past medical history of PCOS, UTI yeast infection.  No history of diabetes, hypertension, pyelonephritis, nephrolithiasis torsion, TOA, gonorrhea, chlamydia, HIV, HSV, syphilis, trichomonas, BV, abdominal surgeries, pancreatitis.  LMP: 11/8.  Unsure if she could be pregnant.  PMD: [facility] in [place].   Past Medical History:  Diagnosis Date  . Allergy   . PCOS (polycystic ovarian syndrome)     Past Surgical History:  Procedure Laterality Date  . WRIST SURGERY Left     Family History  Problem Relation Age of Onset  . Hypertension Mother   . Diabetes Mother   . Diabetes Maternal Grandmother     Social History   Tobacco Use  . Smoking status: Never Smoker  . Smokeless tobacco: Never Used  Substance Use Topics  . Alcohol use: Yes    Alcohol/week: 0.0 standard drinks  . Drug use: No    No current facility-administered medications for this encounter.  No current outpatient medications on file.  Facility-Administered Medications Ordered in  Other Encounters:  .  sodium chloride flush (NS) 0.9 % injection 3 mL, 3 mL, Intravenous, Once, Lavonia Drafts, MD  No Known Allergies   ROS  As noted in HPI.   Physical Exam  BP (!) 122/100 (BP Location: Left Arm)   Pulse 88   Temp 98.2 F (36.8 C) (Oral)   Resp 16   Ht 5\' 2"  (1.575 m)   Wt 99.8 kg   LMP 10/15/2019 (Exact Date)   SpO2 98%   BMI 40.24 kg/m   Constitutional: Well developed, well nourished, no acute distress Eyes:  EOMI, conjunctiva normal bilaterally HENT: Normocephalic, atraumatic,mucus membranes moist Respiratory: Normal inspiratory effort Cardiovascular: Normal rate GI: Normal appearance, soft, nondistended.  Active bowel sounds.  Diffuse lower abdominal tenderness especially in the right lower quadrant.  Positive Rovsing.  No guarding, rebound. Neck: No CVAT GU: Deferred skin: No rash, skin intact Musculoskeletal: no deformities Neurologic: Alert & oriented x 3, no focal neuro deficits Psychiatric: Speech and behavior appropriate   ED Course   Medications - No data to display  Orders Placed This Encounter  Procedures  . Urinalysis, Complete w Microscopic    Standing Status:   Standing    Number of Occurrences:   1  . Pregnancy, urine    Standing Status:   Standing    Number of Occurrences:   1    Results for orders placed or performed during the hospital encounter of 11/04/19 (from the past 24 hour(s))  Urinalysis, Complete w Microscopic  Status: Abnormal   Collection Time: 11/04/19 11:44 AM  Result Value Ref Range   Color, Urine YELLOW YELLOW   APPearance HAZY (A) CLEAR   Specific Gravity, Urine >1.030 (H) 1.005 - 1.030   pH 6.0 5.0 - 8.0   Glucose, UA NEGATIVE NEGATIVE mg/dL   Hgb urine dipstick NEGATIVE NEGATIVE   Bilirubin Urine NEGATIVE NEGATIVE   Ketones, ur NEGATIVE NEGATIVE mg/dL   Protein, ur NEGATIVE NEGATIVE mg/dL   Nitrite NEGATIVE NEGATIVE   Leukocytes,Ua NEGATIVE NEGATIVE   Squamous Epithelial / LPF 6-10 0 - 5    WBC, UA 0-5 0 - 5 WBC/hpf   RBC / HPF 0-5 0 - 5 RBC/hpf   Bacteria, UA RARE (A) NONE SEEN   Amorphous Crystal PRESENT   Pregnancy, urine     Status: None   Collection Time: 11/04/19 11:44 AM  Result Value Ref Range   Preg Test, Ur NEGATIVE NEGATIVE   No results found.  ED Clinical Impression  1. Lower abdominal pain      ED Assessment/Plan  Pregnancy negative.  She gives a contaminated urine sample however it does not appear to be a UTI.  Differential includes appendicitis, ovarian cyst, ruptured ovarian cyst, PID, colitis, diverticulitis.  Doubt TOA, torsion.  Do not have CT or ultrasound available at the clinic today.  Offered to do a pelvic exam here, but patient states that she will wait and have it done at the ER if necessary.  Transferring to the emergency department for a comprehensive evaluation.  Patient declined pain and nausea medication.  Instructed her to be n.p.o. until ER evaluation was complete.  Patient is stable to go by private vehicle.  She agrees to go immediately there.  Discussed with charge nurse.  No orders of the defined types were placed in this encounter.   *This clinic note was created using Dragon dictation software. Therefore, there may be occasional mistakes despite careful proofreading.   ?    Domenick Gong, MD 11/04/19 [phone]

## 2019-11-04 NOTE — ED Provider Notes (Signed)
-----------------------------------------   5:06 PM on 11/04/2019 -----------------------------------------  Patient continues to state some lower abdominal pain although states it is much improved from earlier.  Patient CT scan shows possible mesenteric adenitis although the patient denies any recent diarrheal illness.  I discussed with the patient any risk for STDs she adamantly says she is not at any risk for STDs denies any vaginal discharge.  Offered to perform pelvic exam in the emergency department to send swabs for wet prep and then add on GC/chlamydia.  Patient states she does not believe that is necessary, would rather go home and see if this improves on the next couple days.  Patient states the pain is not bad enough, and she states she would not of come to the emergency department at all for the pain if her mother had not made her.  I did discuss with patient sending her home with a short course of pain medication having her follow-up with her doctor on Monday I also discussed strict return precautions for any worsening pain or fever.  Patient agreeable to plan of care.   Harvest Dark, MD 11/04/19 [phone]

## 2019-11-04 NOTE — ED Triage Notes (Signed)
Patient c/o lower abdominal pain that started yesterday.  Patient denies N/V/D.  Patient denies urinary symptoms.

## 2019-11-04 NOTE — Discharge Instructions (Addendum)
I am concerned that this could be appendicitis, problems with your ovaries, or infection.  Go straight to the emergency department to have this evaluated do not have anything to eat or drink until your ER evaluation is complete.  Let me know if your abdominal pain changes, gets worse, or for other concerns.

## 2019-11-04 NOTE — ED Provider Notes (Signed)
[facility] Emergency Department Provider Note   ____________________________________________    I have reviewed the triage vital signs and the nursing notes.   HISTORY  Chief Complaint Abdominal Pain     HPI Brenda Reese is a 28 y.o. female who presents with lower abdominal pain which she states started yesterday.  Went to urgent care and sent to the ED for further evaluation.  She describes right greater than left lower abdominal pain.  No history of abdominal surgery.  Is not take anything for this.  No fevers or chills.  Positive nausea, no diarrhea.  Past Medical History:  Diagnosis Date  . Allergy   . PCOS (polycystic ovarian syndrome)     Patient Active Problem List   Diagnosis Date Noted  . Bilateral low back pain 10/01/2017  . Missed period 02/04/2016  . PCOS (polycystic ovarian syndrome) 02/04/2016  . Dietary counseling 07/01/2015  . Obesity 07/01/2015    Past Surgical History:  Procedure Laterality Date  . WRIST SURGERY Left     Prior to Admission medications   Not on File     Allergies Patient has no known allergies.  Family History  Problem Relation Age of Onset  . Hypertension Mother   . Diabetes Mother   . Diabetes Maternal Grandmother     Social History Social History   Tobacco Use  . Smoking status: Never Smoker  . Smokeless tobacco: Never Used  Substance Use Topics  . Alcohol use: Yes    Alcohol/week: 0.0 standard drinks  . Drug use: No    Review of Systems  Constitutional: No fever/chills Eyes: No visual changes.  ENT: No sore throat. Cardiovascular: Denies chest pain. Respiratory: Denies shortness of breath. Gastrointestinal: As above Genitourinary: Negative for dysuria.  No vaginal discharge Musculoskeletal: Negative for back pain. Skin: Negative for rash. Neurological: Negative for headaches or weakness   ____________________________________________   PHYSICAL EXAM:  VITAL  SIGNS: ED Triage Vitals  Enc Vitals Group     BP 11/04/19 1335 (!) 114/91     Pulse Rate 11/04/19 1335 86     Resp 11/04/19 1335 20     Temp 11/04/19 1335 98.4 F (36.9 C)     Temp Source 11/04/19 1335 Oral     SpO2 11/04/19 1335 94 %     Weight 11/04/19 1336 100.2 kg (221 lb)     Height 11/04/19 1336 1.575 m (5\' 2" )     Head Circumference --      Peak Flow --      Pain Score 11/04/19 1336 7     Pain Loc --      Pain Edu? --      Excl. in [place]? --     Constitutional: Alert and oriented.  Eyes: Conjunctivae are normal.   Mouth/Throat: Mucous membranes are moist.   Neck:  Painless ROM Cardiovascular: Normal rate, regular rhythm.   Good peripheral circulation. Respiratory: Normal respiratory effort.  No retractions.  Gastrointestinal: Mild tenderness in the right lower quadrant, no distention, no CVA tenderness Genitourinary: deferred per patient request Musculoskeletal:  Warm and well perfused Neurologic:  Normal speech and language. No gross focal neurologic deficits are appreciated.  Skin:  Skin is warm, dry and intact. No rash noted. Psychiatric: Mood and affect are normal. Speech and behavior are normal.  ____________________________________________   LABS (all labs ordered are listed, but only abnormal results are displayed)  Labs Reviewed  CBC - Abnormal; Notable for the following components:  Result Value   WBC 12.2 (*)    MCV 73.9 (*)    MCHC 36.1 (*)    All other components within normal limits  URINALYSIS, COMPLETE (UACMP) WITH MICROSCOPIC - Abnormal; Notable for the following components:   Color, Urine YELLOW (*)    APPearance CLEAR (*)    All other components within normal limits  LIPASE, BLOOD  COMPREHENSIVE METABOLIC PANEL  POCT PREGNANCY, URINE  POC URINE PREG, ED   ____________________________________________  EKG  None ____________________________________________  RADIOLOGY  CT abdomen pelvis  ____________________________________________   PROCEDURES  Procedure(s) performed: No  Procedures   Critical Care performed: No ____________________________________________   INITIAL IMPRESSION / ASSESSMENT AND PLAN / ED COURSE  Pertinent labs & imaging results that were available during my care of the patient were reviewed by me and considered in my medical decision making (see chart for details).  Patient presents with right greater than left lower quadrant pain, mildly elevated white blood cell count, differential includes appendicitis, ovarian cyst, ureterolithiasis  Urinalysis negative for hematuria or infection.  Will send for CT to rule out appendicitis.  Have asked my colleague to follow up on CT, may require [place] if normal CT     ____________________________________________   FINAL CLINICAL IMPRESSION(S) / ED DIAGNOSES  Final diagnoses:  Lower abdominal pain        Note:  This document was prepared using Dragon voice recognition software and may include unintentional dictation errors.   Jene Every, MD 11/04/19 [phone]

## 2019-11-04 NOTE — ED Notes (Signed)
Pt verbalized understanding of discharge instructions. NAD at this time. 

## 2019-11-04 NOTE — ED Triage Notes (Signed)
Lower abdominal pain since yesterday.   

## 2019-11-28 NOTE — Patient Instructions (Signed)
I value your feedback and entrusting us with your care. If you get a  patient survey, I would appreciate you taking the time to let us know about your experience today. Thank you!  As of November 16, 2019, your lab results will be released to your MyChart immediately, before I even have a chance to see them. Please give me time to review them and contact you if there are any abnormalities. Thank you for your patience.  

## 2019-11-28 NOTE — Progress Notes (Addendum)
PCP:  Ashok Norris, MD   Chief Complaint  Patient presents with  . Gynecologic Exam     HPI:      [name] is a 28 y.o. G0P0000 who LMP was Patient's last menstrual period was 11/11/2019 (exact date)., presents today for her annual examination.  Her menses are irregular with PCOS. LMP was 4 days, light to mod flow, mild dysmen. Has occas BTB. Had DUB 9/20 with neg GYN ultrasound. Pt had cycle control with OCPs in past but stopped them for conception. Hasn't used BC for 6 months and still not pregnant. Did fertility supplements OTC last yr that regulated cycles and pt thought she was ovulating. Partner has 3 kids, youngest is 78. Pt with hx of gonorrhea/chlamydia in past. Not taking PNVs.    Sex activity: single partner, contraception - none.  Last Pap: September 29, 2017  Results were: no abnormalities /neg HPV DNA  Hx of STDs: GC, chlamydia in high school. Neg STD testing 9/20.   There is no FH of breast cancer. There is no FH of ovarian cancer. The patient does not do self-breast exams.  Tobacco use: The patient denies current or previous tobacco use. Alcohol use: social drinker No drug use.  Exercise: not active  She does get adequate calcium but not Vitamin D in her diet.   Patient Active Problem List   Diagnosis Date Noted  . Bilateral low back pain 10/01/2017  . Missed period 02/04/2016  . PCOS (polycystic ovarian syndrome) 02/04/2016  . Dietary counseling 07/01/2015  . Obesity 07/01/2015    Past Surgical History:  Procedure Laterality Date  . WRIST SURGERY Left     Family History  Problem Relation Age of Onset  . Hypertension Mother   . Diabetes Mother   . Diabetes Maternal Grandmother     Social History   Socioeconomic History  . Marital status: Single    Spouse name: Not on file  . Number of children: 0  . Years of education: Not on file  . Highest education level: Not on file  Occupational History  . Occupation: Best boy: [employer]  Tobacco Use  . Smoking status: Never Smoker  . Smokeless tobacco: Never Used  Substance and Sexual Activity  . Alcohol use: Yes    Alcohol/week: 0.0 standard drinks  . Drug use: No  . Sexual activity: Yes    Partners: Male    Birth control/protection: None  Other Topics Concern  . Not on file  Social History Narrative  . Not on file   Social Determinants of Health   Financial Resource Strain:   . Difficulty of Paying Living Expenses: Not on file  Food Insecurity:   . Worried About Charity fundraiser in the Last Year: Not on file  . Ran Out of Food in the Last Year: Not on file  Transportation Needs:   . Lack of Transportation (Medical): Not on file  . Lack of Transportation (Non-Medical): Not on file  Physical Activity:   . Days of Exercise per Week: Not on file  . Minutes of Exercise per Session: Not on file  Stress:   . Feeling of Stress : Not on file  Social Connections:   . Frequency of Communication with Friends and Family: Not on file  . Frequency of Social Gatherings with Friends and Family: Not on file  . Attends Religious Services: Not on file  . Active Member of Clubs or Organizations: Not on file  .  Attends Archivist Meetings: Not on file  . Marital Status: Not on file  Intimate Partner Violence:   . Fear of Current or Ex-Partner: Not on file  . Emotionally Abused: Not on file  . Physically Abused: Not on file  . Sexually Abused: Not on file    No current outpatient medications on file.     ROS:  Review of Systems  Constitutional: Negative for fatigue, fever and unexpected weight change.  Respiratory: Negative for cough, shortness of breath and wheezing.   Cardiovascular: Negative for chest pain, palpitations and leg swelling.  Gastrointestinal: Negative for blood in stool, constipation, diarrhea, nausea and vomiting.  Endocrine: Negative for cold intolerance, heat intolerance and polyuria.  Genitourinary: Positive  for menstrual problem. Negative for dyspareunia, dysuria, flank pain, frequency, genital sores, hematuria, pelvic pain, urgency, vaginal bleeding, vaginal discharge and vaginal pain.  Musculoskeletal: Negative for back pain, joint swelling and myalgias.  Skin: Negative for rash.  Neurological: Negative for dizziness, syncope, light-headedness, numbness and headaches.  Hematological: Negative for adenopathy.  Psychiatric/Behavioral: Negative for agitation, confusion, sleep disturbance and suicidal ideas. The patient is not nervous/anxious.   BREAST: No symptoms   Objective: BP 110/80   Ht '5\' 2"'  (1.575 m)   Wt 226 lb (102.5 kg)   LMP 11/11/2019 (Exact Date)   BMI 41.34 kg/m    Physical Exam Constitutional:      Appearance: She is well-developed.  Genitourinary:     Vulva, vagina, cervix, uterus, right adnexa and left adnexa normal.     No vulval lesion or tenderness noted.     No vaginal discharge, erythema or tenderness.     No cervical polyp.     Uterus is not enlarged or tender.     No right or left adnexal mass present.     Right adnexa not tender.     Left adnexa not tender.  Neck:     Thyroid: No thyromegaly.  Cardiovascular:     Rate and Rhythm: Normal rate and regular rhythm.     Heart sounds: Normal heart sounds. No murmur.  Pulmonary:     Effort: Pulmonary effort is normal.     Breath sounds: Normal breath sounds.  Chest:     Breasts:        Right: No mass, nipple discharge, skin change or tenderness.        Left: No mass, nipple discharge, skin change or tenderness.  Abdominal:     Palpations: Abdomen is soft.     Tenderness: There is no abdominal tenderness. There is no guarding.  Musculoskeletal:        General: Normal range of motion.     Cervical back: Normal range of motion.  Neurological:     General: No focal deficit present.     Mental Status: She is alert and oriented to person, place, and time.     Cranial Nerves: No cranial nerve deficit.   Skin:    General: Skin is warm and dry.  Psychiatric:        Mood and Affect: Mood normal.        Behavior: Behavior normal.        Thought Content: Thought content normal.        Judgment: Judgment normal.  Vitals reviewed.     Assessment/Plan: Encounter for annual routine gynecological examination  PCOS (polycystic ovarian syndrome)--Pt aware of importance of Q3 month menses. F/u if no bleeding for provera Rx after neg UPT. Declines BCPs for  now.  Infertility counseling--Discussed infertility with PCOS and possible need for fertility meds. Suggested pt try fertility supp OTC that seemed to give her regular cycles last yr. If having monthly menses, check urine ovulation pred kit. If pos, try to conceive for a yr. If neg or menses cont to be irregular, RTO with MD for further infertility eval/mgmt. Also suggested wt loss for future pregnancy and cycle control.             GYN counsel adequate intake of calcium and vitamin D, diet and exercise, start PNVS.     F/U  Return in about 1 year (around 11/28/2020).  Melyna Huron B. Victormanuel Mclure, PA-C 11/29/2019 9:24 AM

## 2019-11-29 ENCOUNTER — Ambulatory Visit (INDEPENDENT_AMBULATORY_CARE_PROVIDER_SITE_OTHER): Payer: Managed Care, Other (non HMO) | Admitting: Obstetrics and Gynecology

## 2019-11-29 ENCOUNTER — Other Ambulatory Visit: Payer: Self-pay

## 2019-11-29 ENCOUNTER — Encounter: Payer: Self-pay | Admitting: Obstetrics and Gynecology

## 2019-11-29 ENCOUNTER — Ambulatory Visit: Payer: Managed Care, Other (non HMO) | Attending: Internal Medicine

## 2019-11-29 VITALS — BP 110/80 | Ht 62.0 in | Wt 226.0 lb

## 2019-11-29 DIAGNOSIS — Z20822 Contact with and (suspected) exposure to covid-19: Secondary | ICD-10-CM

## 2019-11-29 DIAGNOSIS — Z01419 Encounter for gynecological examination (general) (routine) without abnormal findings: Secondary | ICD-10-CM | POA: Diagnosis not present

## 2019-11-29 DIAGNOSIS — Z3169 Encounter for other general counseling and advice on procreation: Secondary | ICD-10-CM

## 2019-11-29 DIAGNOSIS — E282 Polycystic ovarian syndrome: Secondary | ICD-10-CM

## 2019-11-30 LAB — NOVEL CORONAVIRUS, NAA: SARS-CoV-2, NAA: DETECTED — AB

## 2020-09-04 NOTE — Progress Notes (Signed)
Brenda Mascot, MD   Chief Complaint  Patient presents with  . Amenorrhea    several neg UPTs at home  . Pelvic Pain    left and right side only    HPI:      Brenda Reese is a 29 y.o. G0P0000 whose LMP was Patient's last menstrual period was 07/01/2020 (approximate)., presents today for eval of oligomenorrhea with pelvic pain for 2 months. Hx of PCOS but has been having monthly menses, lasting 3-5 days mod flow, no BTB, mild dysmen, for the past 1 1/2 yrs. Stopped bleeding 2 months ago with neg UPTs, last one yesterday. Pt is trying to conceive.   Has been having BLQ and epigastric pains for the past 2 months. No change in appetite or wt but has pain/feels heavy or full in epigastric area, particularly when first wakes up and with eating; not related to certain foods. No GERD sx. Has has loose stools (not watery), particularly after eating. No odor. No urin sx, vag sx, no n/v, no fevers, no blood in stools. No hx of GI disorders.   She is sex active, no new partners. Taking PNVs.   Past Medical History:  Diagnosis Date  . Allergy   . PCOS (polycystic ovarian syndrome)     Past Surgical History:  Procedure Laterality Date  . WRIST SURGERY Left     Family History  Problem Relation Age of Onset  . Hypertension Mother   . Diabetes Mother   . Diabetes Maternal Grandmother     Social History   Socioeconomic History  . Marital status: Single    Spouse name: Not on file  . Number of children: 0  . Years of education: Not on file  . Highest education level: Not on file  Occupational History  . Occupation: Clinical cytogeneticist: [employer]  Tobacco Use  . Smoking status: Never Smoker  . Smokeless tobacco: Never Used  Vaping Use  . Vaping Use: Never used  Substance and Sexual Activity  . Alcohol use: Yes    Alcohol/week: 0.0 standard drinks  . Drug use: No  . Sexual activity: Yes    Partners: Male    Birth control/protection: None  Other Topics Concern   . Not on file  Social History Narrative  . Not on file   Social Determinants of Health   Financial Resource Strain:   . Difficulty of Paying Living Expenses: Not on file  Food Insecurity:   . Worried About Programme researcher, broadcasting/film/video in the Last Year: Not on file  . Ran Out of Food in the Last Year: Not on file  Transportation Needs:   . Lack of Transportation (Medical): Not on file  . Lack of Transportation (Non-Medical): Not on file  Physical Activity:   . Days of Exercise per Week: Not on file  . Minutes of Exercise per Session: Not on file  Stress:   . Feeling of Stress : Not on file  Social Connections:   . Frequency of Communication with Friends and Family: Not on file  . Frequency of Social Gatherings with Friends and Family: Not on file  . Attends Religious Services: Not on file  . Active Member of Clubs or Organizations: Not on file  . Attends Banker Meetings: Not on file  . Marital Status: Not on file  Intimate Partner Violence:   . Fear of Current or Ex-Partner: Not on file  . Emotionally Abused: Not on file  .  Physically Abused: Not on file  . Sexually Abused: Not on file    No outpatient medications prior to visit.   No facility-administered medications prior to visit.      ROS:  Review of Systems  Constitutional: Positive for fatigue. Negative for fever.  Gastrointestinal: Positive for abdominal pain and diarrhea. Negative for blood in stool, constipation, nausea and vomiting.  Genitourinary: Positive for menstrual problem and pelvic pain. Negative for dyspareunia, dysuria, flank pain, frequency, hematuria, urgency, vaginal bleeding, vaginal discharge and vaginal pain.  Musculoskeletal: Negative for back pain.  Skin: Negative for rash.  BREAST: No symptoms   OBJECTIVE:   Vitals:  BP 120/80   Ht 5\' 1"  (1.549 m)   Wt 233 lb (105.7 kg)   LMP 07/01/2020 (Approximate)   BMI 44.02 kg/m   Physical Exam Vitals reviewed.  Constitutional:       Appearance: She is well-developed.  Pulmonary:     Effort: Pulmonary effort is normal.  Abdominal:     Palpations: Abdomen is soft.     Tenderness: There is abdominal tenderness in the right lower quadrant, epigastric area and left lower quadrant. There is no guarding or rebound.  Genitourinary:    General: Normal vulva.     Pubic Area: No rash.      Labia:        Right: No rash, tenderness or lesion.        Left: No rash, tenderness or lesion.      Vagina: Normal. No vaginal discharge, erythema or tenderness.     Cervix: Normal.     Uterus: Normal. Not enlarged and not tender.      Adnexa:        Right: Tenderness present. No mass.         Left: Tenderness present. No mass.    Musculoskeletal:        General: Normal range of motion.     Cervical back: Normal range of motion.  Skin:    General: Skin is warm and dry.  Neurological:     General: No focal deficit present.     Mental Status: She is alert and oriented to person, place, and time.  Psychiatric:        Mood and Affect: Mood normal.        Behavior: Behavior normal.        Thought Content: Thought content normal.        Judgment: Judgment normal.     Assessment/Plan: Secondary oligomenorrhea - Plan: TSH + free T4; Hx of PCOS but has been having monthly menses. Neg UPT yesterday. Check thyroid. If WNL, can do provera if no menses after 90 days.   Functional diarrhea--check labs, f/u with PCP for further eval  Epigastric pain--tender on exam. F/u with PCP for further eval.  Blood tests for routine general physical examination - Plan: CBC with Differential/Platelet, Comprehensive metabolic panel, TSH + free T4  Thyroid disorder screening - Plan: TSH + free T4     Return if symptoms worsen or fail to improve.  [name] B. Barby Colvard, PA-C 09/05/2020 11:15 AM

## 2020-09-04 NOTE — Patient Instructions (Signed)
I value your feedback and entrusting us with your care. If you get a [facility] patient survey, I would appreciate you taking the time to let us know about your experience today. Thank you!  As of November 16, 2019, your lab results will be released to your MyChart immediately, before I even have a chance to see them. Please give me time to review them and contact you if there are any abnormalities. Thank you for your patience.  

## 2020-09-05 ENCOUNTER — Other Ambulatory Visit: Payer: Self-pay

## 2020-09-05 ENCOUNTER — Encounter: Payer: Self-pay | Admitting: Obstetrics and Gynecology

## 2020-09-05 ENCOUNTER — Ambulatory Visit (INDEPENDENT_AMBULATORY_CARE_PROVIDER_SITE_OTHER): Payer: Managed Care, Other (non HMO) | Admitting: Obstetrics and Gynecology

## 2020-09-05 VITALS — BP 120/80 | Ht 61.0 in | Wt 233.0 lb

## 2020-09-05 DIAGNOSIS — K591 Functional diarrhea: Secondary | ICD-10-CM | POA: Diagnosis not present

## 2020-09-05 DIAGNOSIS — Z1329 Encounter for screening for other suspected endocrine disorder: Secondary | ICD-10-CM

## 2020-09-05 DIAGNOSIS — R1013 Epigastric pain: Secondary | ICD-10-CM

## 2020-09-05 DIAGNOSIS — Z0000 Encounter for general adult medical examination without abnormal findings: Secondary | ICD-10-CM | POA: Diagnosis not present

## 2020-09-05 DIAGNOSIS — N914 Secondary oligomenorrhea: Secondary | ICD-10-CM | POA: Diagnosis not present

## 2020-09-06 LAB — COMPREHENSIVE METABOLIC PANEL
ALT: 16 IU/L (ref 0–32)
AST: 14 IU/L (ref 0–40)
Albumin/Globulin Ratio: 1.7 (ref 1.2–2.2)
Albumin: 4.5 g/dL (ref 3.9–5.0)
Alkaline Phosphatase: 118 IU/L (ref 44–121)
BUN/Creatinine Ratio: 15 (ref 9–23)
BUN: 12 mg/dL (ref 6–20)
Bilirubin Total: <0.2 mg/dL (ref 0.0–1.2)
CO2: 21 mmol/L (ref 20–29)
Calcium: 9.5 mg/dL (ref 8.7–10.2)
Chloride: 102 mmol/L (ref 96–106)
Creatinine, Ser: 0.79 mg/dL (ref 0.57–1.00)
GFR calc Af Amer: 118 mL/min/{1.73_m2} (ref >59)
GFR calc non Af Amer: 102 mL/min/{1.73_m2} (ref >59)
Globulin, Total: 2.7 g/dL (ref 1.5–4.5)
Glucose: 72 mg/dL (ref 65–99)
Potassium: 4.5 mmol/L (ref 3.5–5.2)
Sodium: 139 mmol/L (ref 134–144)
Total Protein: 7.2 g/dL (ref 6.0–8.5)

## 2020-09-06 LAB — CBC WITH DIFFERENTIAL/PLATELET
Basophils Absolute: 0.1 10*3/uL (ref 0.0–0.2)
Basos: 1 %
EOS (ABSOLUTE): 0.1 10*3/uL (ref 0.0–0.4)
Eos: 1 %
Hematocrit: 42.2 % (ref 34.0–46.6)
Hemoglobin: 14.5 g/dL (ref 11.1–15.9)
Immature Grans (Abs): 0 10*3/uL (ref 0.0–0.1)
Immature Granulocytes: 0 %
Lymphocytes Absolute: 2.6 10*3/uL (ref 0.7–3.1)
Lymphs: 28 %
MCH: 28.2 pg (ref 26.6–33.0)
MCHC: 34.4 g/dL (ref 31.5–35.7)
MCV: 82 fL (ref 79–97)
Monocytes Absolute: 0.5 10*3/uL (ref 0.1–0.9)
Monocytes: 5 %
Neutrophils Absolute: 6 10*3/uL (ref 1.4–7.0)
Neutrophils: 65 %
Platelets: 399 10*3/uL (ref 150–450)
RBC: 5.14 x10E6/uL (ref 3.77–5.28)
RDW: 14.7 % (ref 11.7–15.4)
WBC: 9.2 10*3/uL (ref 3.4–10.8)

## 2020-09-06 LAB — TSH+FREE T4
Free T4: 1.15 ng/dL (ref 0.82–1.77)
TSH: 1.09 u[IU]/mL (ref 0.450–4.500)

## 2020-10-04 IMAGING — CT CT ABD-PELV W/ CM
2 of 4 series · 16 of 46 positions shown, 18 images · IV contrast (APPLIED)
Comparison: None.

CLINICAL DATA: Lower abdominal pain

EXAM:
CT ABDOMEN AND PELVIS WITH CONTRAST
TECHNIQUE: Multidetector CT imaging of the abdomen and pelvis was performed
using the standard protocol following bolus administration of
intravenous contrast.
CONTRAST:  100mL OMNIPAQUE IOHEXOL 300 MG/ML  SOLN

[Series 2: routine abd/pel with · axial · 0.98mm/px · z∈[-456,-46]mm · 13 of 90 slices shown, 15 images]
[im 4/90  soft-tissue]
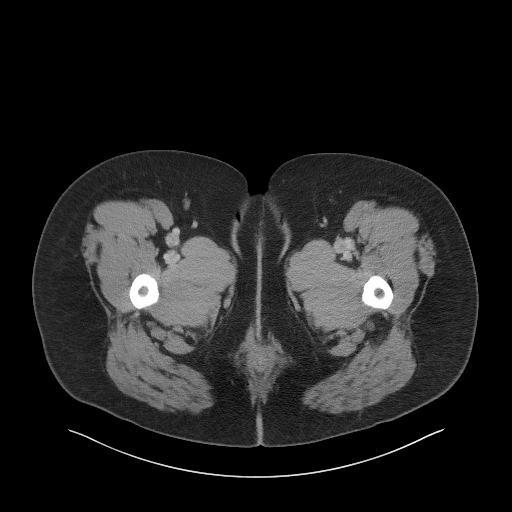
[im 4/90  bone]
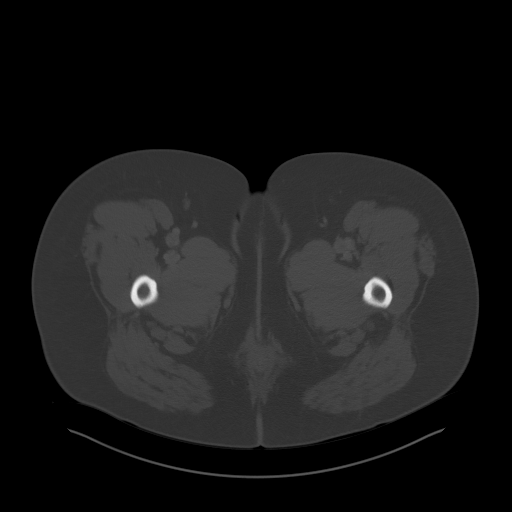
[im 11/90  soft-tissue]
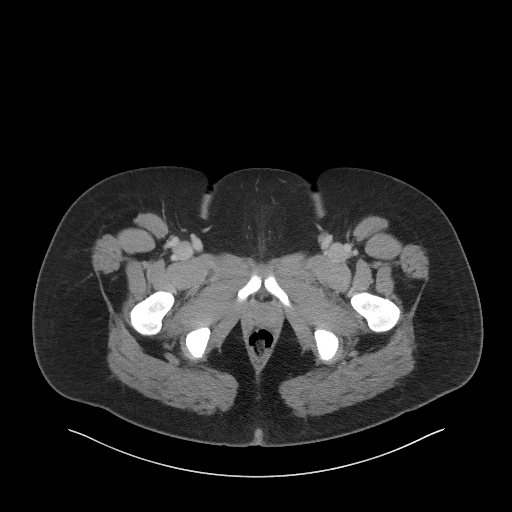
[im 18/90  soft-tissue]
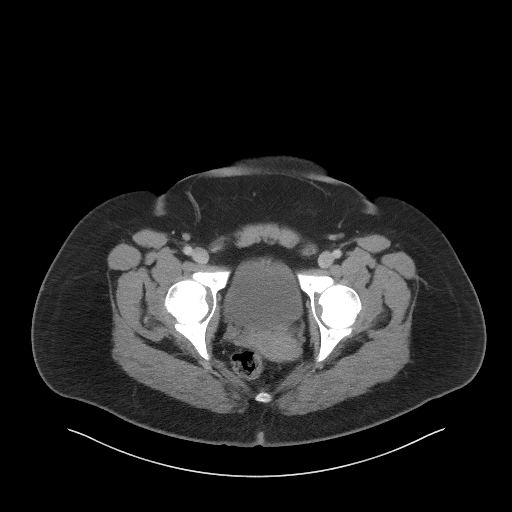
[im 25/90  soft-tissue]
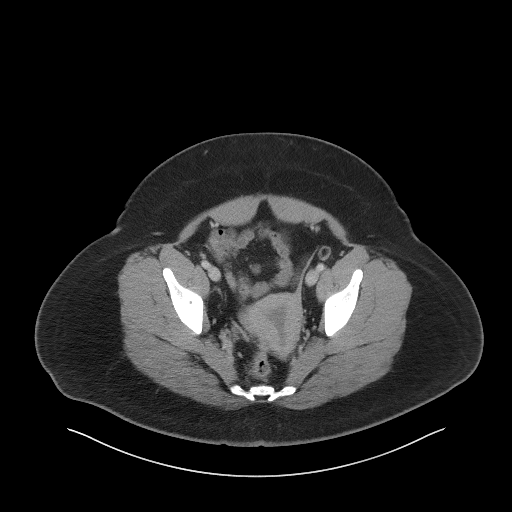
[im 33/90  soft-tissue]
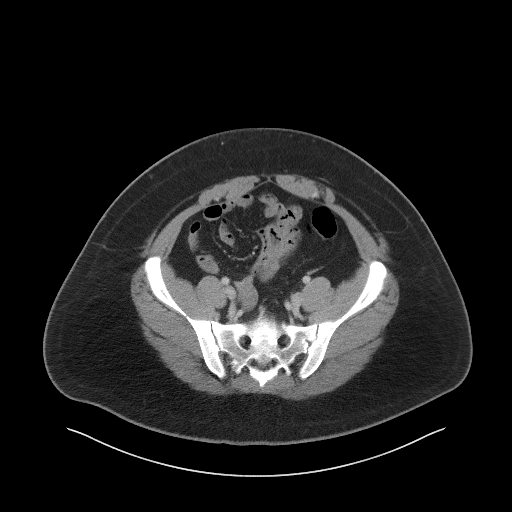
[im 40/90  soft-tissue]
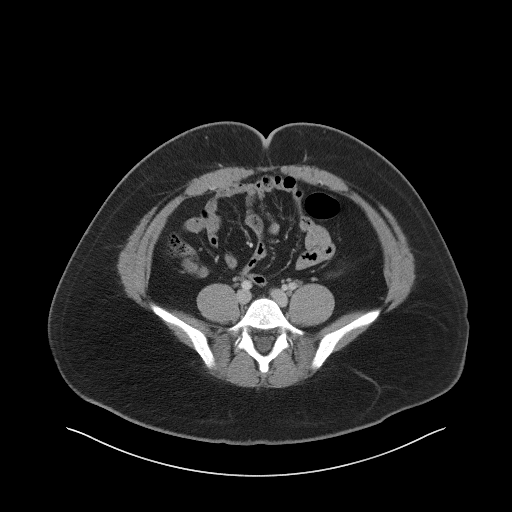
[im 47/90  soft-tissue]
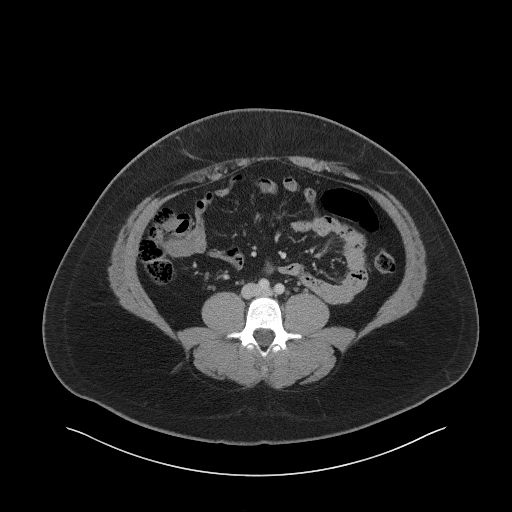
[im 50/90  soft-tissue]
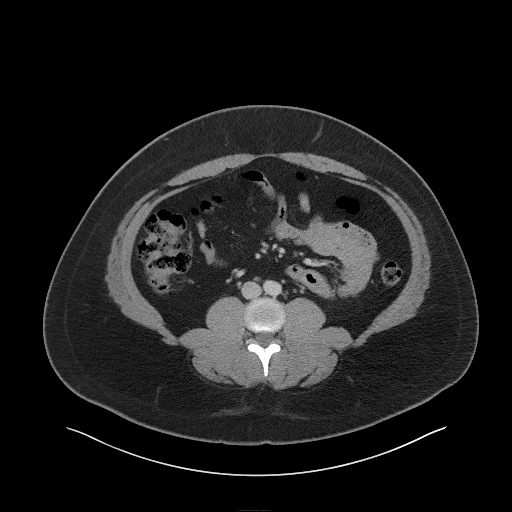
[im 57/90  soft-tissue]
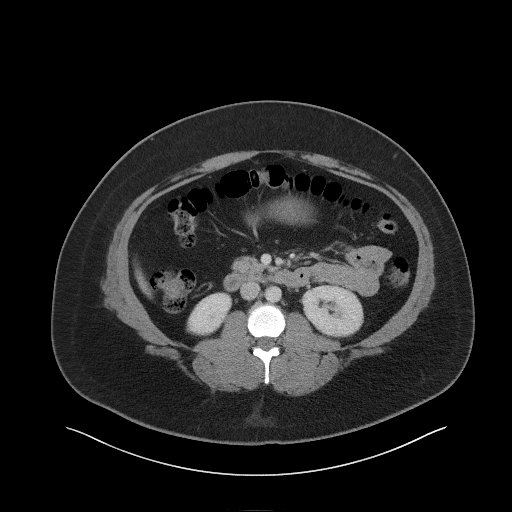
[im 57/90  bone]
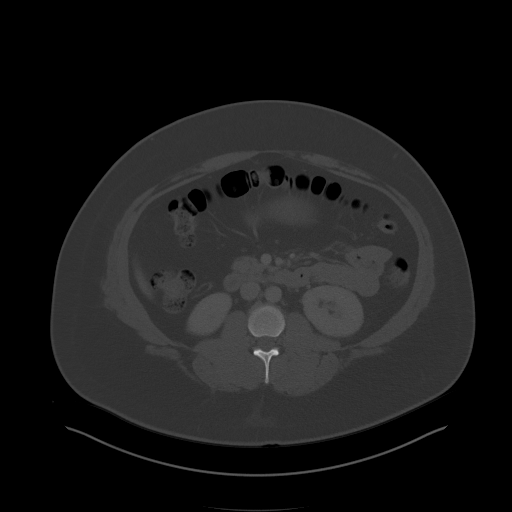
[im 65/90  soft-tissue]
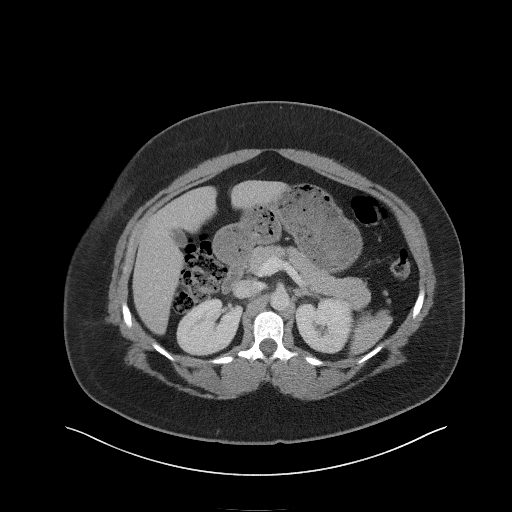
[im 72/90  soft-tissue]
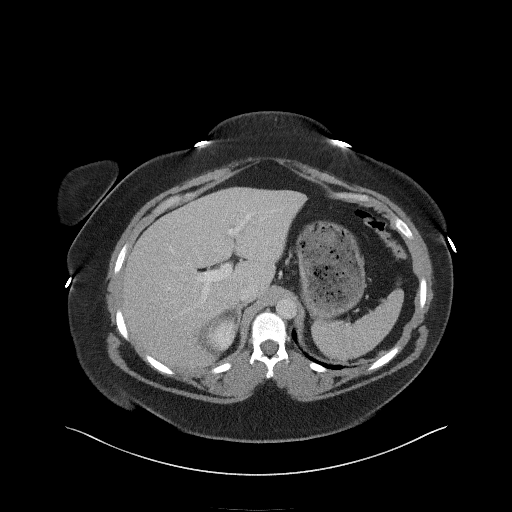
[im 79/90  soft-tissue]
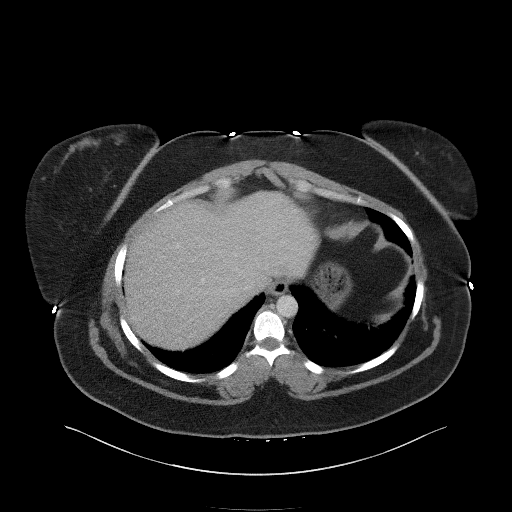
[im 86/90  soft-tissue]
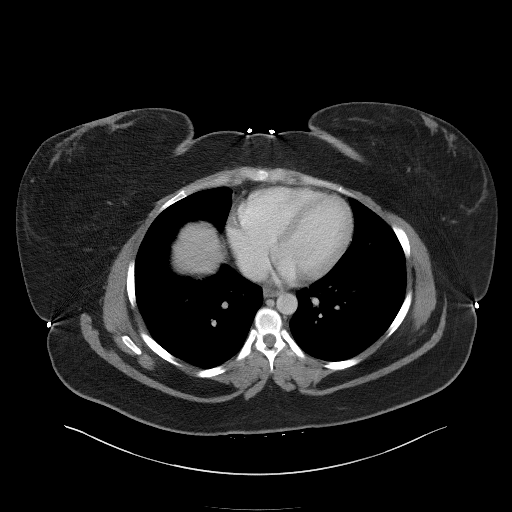

[Series 5: coronal st · coronal · 0.82mm/px · 3 of 106 slices shown]
[im 36/106  soft-tissue]
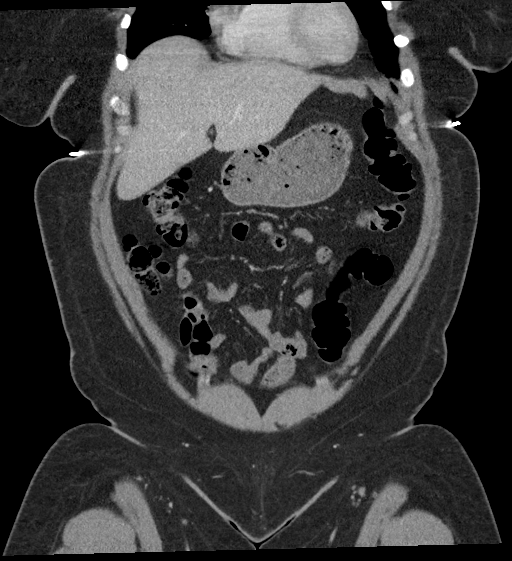
[im 47/106  soft-tissue]
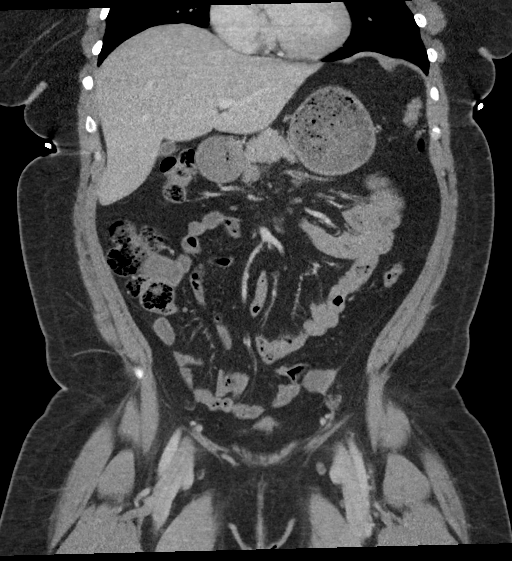
[im 59/106  soft-tissue]
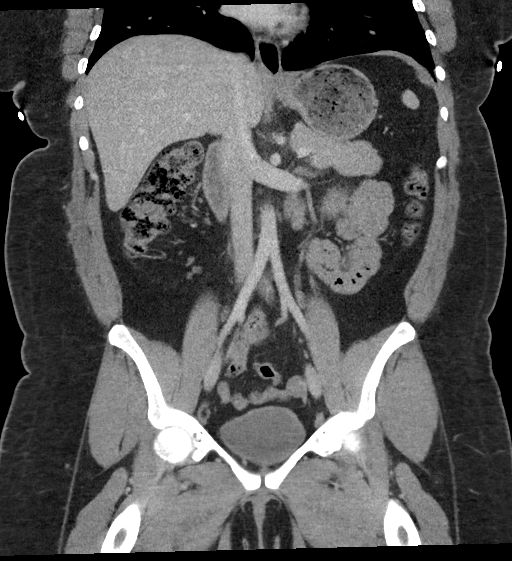

[16 of 46 positions shown; findings below may reference images not displayed]

FINDINGS: Lower chest: 6 there is atelectatic change in the left lung base.
There is no lung base edema or consolidation.

Hepatobiliary: No focal liver lesions are evident. Gallbladder wall
is not appreciably thickened. There is no biliary duct dilatation.

Pancreas: There is no pancreatic mass or inflammatory focus.

Spleen: No splenic lesions are evident.

Adrenals/Urinary Tract: Adrenals bilaterally appear unremarkable.
Kidneys bilaterally show no evident mass or hydronephrosis on either
side. There is no evident renal or ureteral calculus on either side.
Urinary bladder is midline with wall thickness within normal limits.

Stomach/Bowel: There is no appreciable bowel wall or mesenteric
thickening. Terminal ileum appears normal. There is no evident bowel
obstruction. There is no free air or portal venous air.

Vascular/Lymphatic: There is no abdominal aortic aneurysm. No
vascular lesions are evident. There is no adenopathy in the abdomen
or pelvis by size criteria. There are several subcentimeter lymph
nodes in the right mid to lower abdomen.

Reproductive: Uterus is anteverted.  No evident pelvic mass.

Other: Appendix appears normal. There is no evident abscess or
ascites in the abdomen or pelvis. There is a slight amount of fat in
the umbilicus.

Musculoskeletal: No blastic or lytic bone lesions. There are no
intramuscular lesions.
IMPRESSION: 1. There are several subcentimeter mesenteric lymph nodes in the
right abdomen. In the appropriate clinical setting, this finding
could be indicative of a degree of mesenteric adenitis. No
adenopathy in the abdomen and pelvis noted by size criteria.

2. No bowel obstruction. No abscess in the abdomen or pelvis.
Appendix appears normal.

3. No renal or ureteral calculus. No hydronephrosis on either side.
Urinary bladder wall thickness is within normal limits.

## 2021-04-22 NOTE — Patient Instructions (Signed)
I value your feedback and you entrusting us with your care. If you get a [facility] patient survey, I would appreciate you taking the time to let us know about your experience today. Thank you! ? ? ?

## 2021-04-22 NOTE — Progress Notes (Signed)
Brenda Mascot, MD   Chief Complaint  Patient presents with  . Amenorrhea    Having some lower abdominal pain    HPI:      Brenda Reese is a 30 y.o. G0P0000 whose LMP was Patient's last menstrual period was 09/09/2020 (approximate)., presents today for amenorrhea since 10/21. Hx of PCOS in past but cycled monthly for about 1 1/2 yrs in 2021. Seen 9/21 for late menses but then had period 10/21. No menses since, no BTB. Pt did OCPs in past with good cycle control, but stopped in order to conceive. Hasn't gotten pregnant yet.  She has also been having LBP radiating to pelvic area. Pain is intermittent and crampy, worse when lying down at night and getting up. Sx improve with activity. No prior heavy lifting/back injury. No urin, vag or GI sx.  She is sex active, no pain/bleeding.    Past Medical History:  Diagnosis Date  . Allergy   . PCOS (polycystic ovarian syndrome)     Past Surgical History:  Procedure Laterality Date  . WRIST SURGERY Left     Family History  Problem Relation Age of Onset  . Hypertension Mother   . Diabetes Mother   . Diabetes Maternal Grandmother     Social History   Socioeconomic History  . Marital status: Single    Spouse name: Not on file  . Number of children: 0  . Years of education: Not on file  . Highest education level: Not on file  Occupational History  . Occupation: Clinical cytogeneticist: [employer]  Tobacco Use  . Smoking status: Never Smoker  . Smokeless tobacco: Never Used  Vaping Use  . Vaping Use: Never used  Substance and Sexual Activity  . Alcohol use: Yes    Alcohol/week: 0.0 standard drinks  . Drug use: No  . Sexual activity: Yes    Partners: Male    Birth control/protection: None  Other Topics Concern  . Not on file  Social History Narrative  . Not on file   Social Determinants of Health   Financial Resource Strain: Not on file  Food Insecurity: Not on file  Transportation Needs: Not on file   Physical Activity: Not on file  Stress: Not on file  Social Connections: Not on file  Intimate Partner Violence: Not on file    No outpatient medications prior to visit.   No facility-administered medications prior to visit.      ROS:  Review of Systems  Constitutional: Negative for fever.  Gastrointestinal: Negative for blood in stool, constipation, diarrhea, nausea and vomiting.  Genitourinary: Positive for menstrual problem. Negative for dyspareunia, dysuria, flank pain, frequency, hematuria, urgency, vaginal bleeding, vaginal discharge and vaginal pain.  Musculoskeletal: Positive for back pain.  Skin: Negative for rash.    OBJECTIVE:   Vitals:  BP 120/70   Ht 5\' 2"  (1.575 m)   Wt 238 lb (108 kg)   LMP 09/09/2020 (Approximate)   BMI 43.53 kg/m   Physical Exam Vitals reviewed.  Constitutional:      Appearance: She is well-developed.  Pulmonary:     Effort: Pulmonary effort is normal.  Genitourinary:    General: Normal vulva.     Pubic Area: No rash.      Labia:        Right: No rash, tenderness or lesion.        Left: No rash, tenderness or lesion.      Vagina: Normal. No  vaginal discharge, erythema or tenderness.     Cervix: Normal.     Uterus: Normal. Not enlarged and not tender.      Adnexa: Right adnexa normal and left adnexa normal.       Right: No mass or tenderness.         Left: No mass or tenderness.    Musculoskeletal:        General: Normal range of motion.       Arms:     Cervical back: Normal range of motion.  Skin:    General: Skin is warm and dry.  Neurological:     General: No focal deficit present.     Mental Status: She is alert and oriented to person, place, and time.  Psychiatric:        Mood and Affect: Mood normal.        Behavior: Behavior normal.        Thought Content: Thought content normal.        Judgment: Judgment normal.     Results: Results for orders placed or performed in visit on 04/23/21 (from the past 24  hour(s))  POCT urine pregnancy     Status: Normal   Collection Time: 04/23/21 10:47 AM  Result Value Ref Range   Preg Test, Ur Negative Negative     Assessment/Plan: Amenorrhea - Plan: medroxyPROGESTERone (PROVERA) 10 MG tablet, POCT urine pregnancy; neg UPT. Hx of PCOS. Rx provera. F/u if no withdrawal bleed. Discussed wt loss/low carb diet to see if menses resume to monthly. Needs menses Q3 mos so f/u for provera again if no bleeding. Pt wants to conceive.   PCOS (polycystic ovarian syndrome)  Strain of lumbar region, initial encounter--stretch, ice/heat, massage, NSAIDs. F/u prn.    Meds ordered this encounter  Medications  . medroxyPROGESTERone (PROVERA) 10 MG tablet    Sig: Take 1 tablet (10 mg total) by mouth daily for 7 days.    Dispense:  7 tablet    Refill:  0    Order Specific Question:   Supervising Provider    Answer:   Nadara Mustard [[id number]]      Return if symptoms worsen or fail to improve.  Marquis Diles B. Arlana Canizales, PA-C 04/23/2021 10:50 AM

## 2021-04-23 ENCOUNTER — Encounter: Payer: Self-pay | Admitting: Obstetrics and Gynecology

## 2021-04-23 ENCOUNTER — Other Ambulatory Visit: Payer: Self-pay

## 2021-04-23 ENCOUNTER — Ambulatory Visit (INDEPENDENT_AMBULATORY_CARE_PROVIDER_SITE_OTHER): Payer: Managed Care, Other (non HMO) | Admitting: Obstetrics and Gynecology

## 2021-04-23 VITALS — BP 120/70 | Ht 62.0 in | Wt 238.0 lb

## 2021-04-23 DIAGNOSIS — E282 Polycystic ovarian syndrome: Secondary | ICD-10-CM

## 2021-04-23 DIAGNOSIS — S39012A Strain of muscle, fascia and tendon of lower back, initial encounter: Secondary | ICD-10-CM

## 2021-04-23 DIAGNOSIS — N912 Amenorrhea, unspecified: Secondary | ICD-10-CM

## 2021-04-23 LAB — POCT URINE PREGNANCY: Preg Test, Ur: NEGATIVE

## 2021-04-23 MED ORDER — MEDROXYPROGESTERONE ACETATE 10 MG PO TABS
10.0000 mg | ORAL_TABLET | Freq: Every day | ORAL | 0 refills | Status: DC
  Filled 2021-04-23: qty 7, 7d supply, fill #0

## 2021-04-24 ENCOUNTER — Other Ambulatory Visit: Payer: Self-pay

## 2022-03-21 ENCOUNTER — Emergency Department
Admission: EM | Admit: 2022-03-21 | Discharge: 2022-03-21 | Disposition: A | Discharge status: Home / Self Care | Payer: Managed Care, Other (non HMO) | Attending: Student in an Organized Health Care Education/Training Program | Admitting: Student in an Organized Health Care Education/Training Program

## 2022-03-21 ENCOUNTER — Encounter: Payer: Self-pay | Admitting: Emergency Medicine

## 2022-03-21 ENCOUNTER — Emergency Department: Payer: Managed Care, Other (non HMO)

## 2022-03-21 ENCOUNTER — Other Ambulatory Visit: Payer: Self-pay

## 2022-03-21 DIAGNOSIS — R1031 Right lower quadrant pain: Secondary | ICD-10-CM | POA: Insufficient documentation

## 2022-03-21 DIAGNOSIS — R1030 Lower abdominal pain, unspecified: Secondary | ICD-10-CM

## 2022-03-21 DIAGNOSIS — R110 Nausea: Secondary | ICD-10-CM | POA: Diagnosis not present

## 2022-03-21 LAB — COMPREHENSIVE METABOLIC PANEL
ALT: 17 U/L (ref 0–44)
AST: 18 U/L (ref 15–41)
Albumin: 4.2 g/dL (ref 3.5–5.0)
Alkaline Phosphatase: 88 U/L (ref 38–126)
Anion gap: 9 (ref 5–15)
BUN: 11 mg/dL (ref 6–20)
CO2: 24 mmol/L (ref 22–32)
Calcium: 9.5 mg/dL (ref 8.9–10.3)
Chloride: 104 mmol/L (ref 98–111)
Creatinine, Ser: 0.75 mg/dL (ref 0.44–1.00)
GFR, Estimated: >60 mL/min (ref >60)
Glucose, Bld: 89 mg/dL (ref 70–99)
Potassium: 4.5 mmol/L (ref 3.5–5.1)
Sodium: 137 mmol/L (ref 135–145)
Total Bilirubin: 0.6 mg/dL (ref 0.3–1.2)
Total Protein: 8.2 g/dL — ABNORMAL HIGH (ref 6.5–8.1)

## 2022-03-21 LAB — LIPASE, BLOOD: Lipase: 27 U/L (ref 11–51)

## 2022-03-21 LAB — URINALYSIS, ROUTINE W REFLEX MICROSCOPIC
Bilirubin Urine: NEGATIVE
Glucose, UA: NEGATIVE mg/dL
Hgb urine dipstick: NEGATIVE
Ketones, ur: NEGATIVE mg/dL
Leukocytes,Ua: NEGATIVE
Nitrite: NEGATIVE
Protein, ur: NEGATIVE mg/dL
Specific Gravity, Urine: 1.016 (ref 1.005–1.030)
pH: 5 (ref 5.0–8.0)

## 2022-03-21 LAB — CBC
HCT: 41.8 % (ref 36.0–46.0)
Hemoglobin: 14.2 g/dL (ref 12.0–15.0)
MCH: 25.5 pg — ABNORMAL LOW (ref 26.0–34.0)
MCHC: 34 g/dL (ref 30.0–36.0)
MCV: 75.2 fL — ABNORMAL LOW (ref 80.0–100.0)
Platelets: 428 10*3/uL — ABNORMAL HIGH (ref 150–400)
RBC: 5.56 MIL/uL — ABNORMAL HIGH (ref 3.87–5.11)
RDW: 14.7 % (ref 11.5–15.5)
WBC: 9.5 10*3/uL (ref 4.0–10.5)
nRBC: 0 % (ref 0.0–0.2)

## 2022-03-21 LAB — POC URINE PREG, ED: Preg Test, Ur: NEGATIVE

## 2022-03-21 MED ORDER — IOHEXOL 300 MG/ML  SOLN
100.0000 mL | Freq: Once | INTRAMUSCULAR | Status: AC | PRN
  Administered 2022-03-21: 100 mL via INTRAVENOUS

## 2022-03-21 NOTE — ED Notes (Signed)
DC ppw provided. Pt denies any questions at this time. Pt follow up reviewed. Pt provides verbal consent for DC. ?

## 2022-03-21 NOTE — ED Notes (Signed)
Patient transported to CT 

## 2022-03-21 NOTE — ED Provider Notes (Signed)
? ?[facility] ?Provider Note ? ? ? Event Date/Time  ? First MD Initiated Contact with Patient 03/21/22 [phone]   ?  (approximate) ? ? ?History  ? ?Abdominal Pain ? ? ?HPI ? ?Brenda Reese is a 31 y.o. female with a history of PCOS who presents to the ER for evaluation of progressively worsening right lower quadrant abdominal pain associated with nausea and anorexia no measured fevers or chills does have some pain wrapping around to her back denies any dysuria normal bowel movements.  No vaginal discharge or bleeding.  Does not that she is pregnant.  Has never had pain like this before.  Denies any shortness of breath or chest pain. ?  ? ? ?Physical Exam  ? ?Triage Vital Signs: ?ED Triage Vitals [03/21/22 0916]  ?Enc Vitals Group  ?   BP (!) 128/91  ?   Pulse Rate 81  ?   Resp 16  ?   Temp 98.1 ?F (36.7 ?C)  ?   Temp Source Oral  ?   SpO2 100 %  ?   Weight 240 lb (108.9 kg)  ?   Height 5\' 2"  (1.575 m)  ?   Head Circumference   ?   Peak Flow   ?   Pain Score 6  ?   Pain Loc   ?   Pain Edu?   ?   Excl. in GC?   ? ? ?Most recent vital signs: ?Vitals:  ? 03/21/22 1015 03/21/22 1030  ?BP:    ?Pulse: 85 73  ?Resp:    ?Temp:    ?SpO2: 100% 98%  ? ? ? ?Constitutional: Alert  ?Eyes: Conjunctivae are normal.  ?Head: Atraumatic. ?Nose: No congestion/rhinnorhea. ?Mouth/Throat: Mucous membranes are moist.   ?Neck: Painless ROM.  ?Cardiovascular:   Good peripheral circulation. ?Respiratory: Normal respiratory effort.  No retractions.  ?Gastrointestinal: Soft with mild tenderness to palpation in right lower quadrant somewhat limited due to body habitus no appreciable hernia. ?Musculoskeletal:  no deformity ?Neurologic:  MAE spontaneously. No gross focal neurologic deficits are appreciated.  ?Skin:  Skin is warm, dry and intact. No rash noted. ?Psychiatric: Mood and affect are normal. Speech and behavior are normal. ? ? ? ?ED Results / Procedures / Treatments  ? ?Labs ?(all labs ordered are listed, but  only abnormal results are displayed) ?Labs Reviewed  ?COMPREHENSIVE METABOLIC PANEL - Abnormal; Notable for the following components:  ?    Result Value  ? Total Protein 8.2 (*)   ? All other components within normal limits  ?CBC - Abnormal; Notable for the following components:  ? RBC 5.56 (*)   ? MCV 75.2 (*)   ? MCH 25.5 (*)   ? Platelets 428 (*)   ? All other components within normal limits  ?URINALYSIS, ROUTINE W REFLEX MICROSCOPIC - Abnormal; Notable for the following components:  ? Color, Urine YELLOW (*)   ? APPearance HAZY (*)   ? All other components within normal limits  ?LIPASE, BLOOD  ?POC URINE PREG, ED  ? ? ? ?EKG ? ? ? ? ?RADIOLOGY ?Please see ED Course for my review and interpretation. ? ?I personally reviewed all radiographic images ordered to evaluate for the above acute complaints and reviewed radiology reports and findings.  These findings were personally discussed with the patient.  Please see medical record for radiology report. ? ? ? ?PROCEDURES: ? ?Critical Care performed: No ? ?Procedures ? ? ?MEDICATIONS ORDERED IN ED: ?Medications  ?iohexol (OMNIPAQUE) 300  MG/ML solution 100 mL (100 mLs Intravenous Contrast Given 03/21/22 1038)  ? ? ? ?IMPRESSION / MDM / ASSESSMENT AND PLAN / ED COURSE  ?I reviewed the triage vital signs and the nursing notes. ?             ?               ? ?Differential diagnosis includes, but is not limited to, appendicitis, hernia, cystitis, TOA, cyst, torsion, colitis, musculoskeletal strain, stone ? ?Patient presented to ER with symptoms as described above.  No leukocytosis, she is afebrile.  Exam limited due to body habitus.  Given presenting symptoms will order CT imaging to further evaluate. ? ? ?Clinical Course as of 03/21/22 1113  ?Sat Mar 21, 2022  ?1052 My review of CT imaging I do not see any evidence of abscess or perforation will await formal radiology report [PR]  ?1111 Patient reassessed.  Remains well-appearing in no acute distress.  Work-up is  reassuring.  Discussed CT imaging findings with patient.  At this point we discussed option for pelvic ultrasound but she states that she feels well and feels comfortable with observation as an outpatient.  We discussed strict return precautions.  Patient agreeable to plan. [PR]  ?  ?Clinical Course User Index ?[PR] Willy Eddy, MD  ? ? ? ?FINAL CLINICAL IMPRESSION(S) / ED DIAGNOSES  ? ?Final diagnoses:  ?Lower abdominal pain  ? ? ? ?Rx / DC Orders  ? ?ED Discharge Orders   ? ? None  ? ?  ? ? ? ?Note:  This document was prepared using Dragon voice recognition software and may include unintentional dictation errors. ? ?  ?Willy Eddy, MD ?03/21/22 1113 ? ?

## 2022-03-21 NOTE — Discharge Instructions (Signed)

## 2022-03-21 NOTE — ED Triage Notes (Signed)
Pt to ED via POV c/o bilateral lower abdominal pain x 3 days. Pt states that she intermittently gets stabbing pains on the lower right side in her back. Pt denies N/V/D. Pt denies fevers. Pt is in NAD.  ?

## 2022-03-24 ENCOUNTER — Ambulatory Visit: Payer: Managed Care, Other (non HMO) | Admitting: Obstetrics and Gynecology

## 2022-12-18 ENCOUNTER — Other Ambulatory Visit (HOSPITAL_COMMUNITY)
Admission: RE | Admit: 2022-12-18 | Discharge: 2022-12-18 | Disposition: A | Discharge status: Home / Self Care | Payer: 59 | Source: Ambulatory Visit | Attending: Advanced Practice Midwife | Admitting: Advanced Practice Midwife

## 2022-12-18 ENCOUNTER — Ambulatory Visit: Payer: Managed Care, Other (non HMO) | Admitting: Advanced Practice Midwife

## 2022-12-18 ENCOUNTER — Encounter: Payer: Self-pay | Admitting: Advanced Practice Midwife

## 2022-12-18 VITALS — BP 120/80 | Ht 61.5 in | Wt 234.0 lb

## 2022-12-18 DIAGNOSIS — Z01419 Encounter for gynecological examination (general) (routine) without abnormal findings: Secondary | ICD-10-CM | POA: Diagnosis present

## 2022-12-18 DIAGNOSIS — Z124 Encounter for screening for malignant neoplasm of cervix: Secondary | ICD-10-CM

## 2022-12-18 NOTE — Progress Notes (Signed)
[facility]  Gynecology Annual Exam   PCP: Pcp, No  Chief Complaint:  Chief Complaint  Patient presents with   Annual Exam   Breast Pain    History of Present Illness: Patient is a 32 y.o. G0P0000 presents for annual exam. The patient has complaint today of breast pain- left greater than right- for the past few weeks. More recently she has noticed redness around the areolas. She was fitted for a bra a few months ago. She went to the ER in [place] a week ago due to the pain. No lumps were found. Unable to do breast imaging in the ER. We discussed possible reasons for the pain/redness. We discussed comfort measures. Be sure to have well fitting bra. She plans to have breast reduction surgery. She prefers to wait on diagnostic mammo if there are no concerning findings today and will consider for worsening symptoms.    LMP: Patient's last menstrual period was 12/12/2022. Average Interval: regular, 28 days Duration of flow:  3-4  days Heavy Menses: no (most recent period was heavy with clots) Clots: no Intermenstrual Bleeding: no Postcoital Bleeding: no Dysmenorrhea: no  The patient is sexually active. She currently uses none for contraception. She denies dyspareunia.  The patient does perform self breast exams.  There is no notable family history of breast or ovarian cancer in her family.  The patient wears seatbelts: yes.   The patient has regular exercise: she has begun exercising more- at the start of the new year (resolutions to increase healthy lifestyle). She works out at [employer], she is eating a healthy diet and hydrating with h2o. She usually has 4 hours of sleep due to working 2 jobs.   The patient denies current symptoms of depression.    Review of Systems: Review of Systems  Constitutional:  Negative for chills and fever.  HENT:  Negative for congestion, ear discharge, ear pain, hearing loss, sinus pain and sore throat.   Eyes:  Negative for blurred vision and double  vision.  Respiratory:  Negative for cough, shortness of breath and wheezing.   Cardiovascular:  Negative for chest pain, palpitations and leg swelling.  Gastrointestinal:  Negative for abdominal pain, blood in stool, constipation, diarrhea, heartburn, melena, nausea and vomiting.  Genitourinary:  Negative for dysuria, flank pain, frequency, hematuria and urgency.  Musculoskeletal:  Negative for back pain, joint pain and myalgias.  Skin:  Negative for itching and rash.  Neurological:  Negative for dizziness, tingling, tremors, sensory change, speech change, focal weakness, seizures, loss of consciousness, weakness and headaches.  Endo/Heme/Allergies:  Negative for environmental allergies. Does not bruise/bleed easily.  Psychiatric/Behavioral:  Negative for depression, hallucinations, memory loss, substance abuse and suicidal ideas. The patient is not nervous/anxious and does not have insomnia.   Breast: positive for generalized pain and redness around the areola  Past Medical History:  Patient Active Problem List   Diagnosis Date Noted   Amenorrhea 04/23/2021   Bilateral low back pain 10/01/2017    Due to breast size    Missed period 02/04/2016   PCOS (polycystic ovarian syndrome) 02/04/2016   Dietary counseling 07/01/2015   Obesity 07/01/2015    Past Surgical History:  Past Surgical History:  Procedure Laterality Date   WRIST SURGERY Left     Gynecologic History:  Patient's last menstrual period was 12/12/2022. Contraception: none Last Pap: 2018 Results were: no abnormalities   Obstetric History: G0P0000  Family History:  Family History  Problem Relation Age of Onset  Hypertension Mother    Diabetes Mother    Diabetes Maternal Grandmother     Social History:  Social History   Socioeconomic History   Marital status: Single    Spouse name: Not on file   Number of children: 0   Years of education: Not on file   Highest education level: Not on file  Occupational  History   Occupation: Programme researcher, broadcasting/film/video: [employer]  Tobacco Use   Smoking status: Never   Smokeless tobacco: Never  Vaping Use   Vaping Use: Never used  Substance and Sexual Activity   Alcohol use: Yes    Alcohol/week: 0.0 standard drinks of alcohol   Drug use: No   Sexual activity: Yes    Partners: Male    Birth control/protection: None  Other Topics Concern   Not on file  Social History Narrative   Not on file   Social Determinants of Health   Financial Resource Strain: Not on file  Food Insecurity: Not on file  Transportation Needs: Not on file  Physical Activity: Not on file  Stress: Not on file  Social Connections: Not on file  Intimate Partner Violence: Not on file    Allergies:  No Known Allergies  Medications: Prior to Admission medications   Not on File    Physical Exam Vitals: Blood pressure 120/80, height 5' 1.5" (1.562 m), weight 234 lb (106.1 kg), last menstrual period 12/12/2022.  General: NAD HEENT: normocephalic, anicteric Thyroid: no enlargement, no palpable nodules Pulmonary: No increased work of breathing, CTAB Cardiovascular: RRR, distal pulses 2+ Breast: large symmetrical breasts, no tenderness to palpation, no palpable nodules or masses, no skin or nipple retraction present, no nipple discharge.  No axillary or supraclavicular lymphadenopathy. Pale red tone around areola. No warmth or tenderness Abdomen: NABS, soft, non-tender, non-distended.  Umbilicus without lesions.  No hepatomegaly, splenomegaly or masses palpable. No evidence of hernia  Genitourinary:  External: Normal external female genitalia.  Normal urethral meatus, normal Bartholin's and Skene's glands.    Vagina: Normal vaginal mucosa, no evidence of prolapse.    Cervix: Grossly normal in appearance, no bleeding Extremities: no edema, erythema, or tenderness Neurologic: Grossly intact Psychiatric: mood appropriate, affect full   Assessment: 32 y.o. G0P0000 routine annual  exam  Plan: Problem List Items Addressed This Visit   None Visit Diagnoses     Well woman exam with routine gynecological exam    -  Primary   Relevant Orders   Cytology - PAP   Screening for cervical cancer       Relevant Orders   Cytology - PAP       1) STI screening  was offered and declined  2)  ASCCP guidelines and rationale discussed.  Patient opts for every 5 years screening interval  3) Contraception - the patient is currently using  none.  She is attempting to conceive in the near future  4) Routine healthcare maintenance including cholesterol, diabetes screening discussed Declines  5) Breast pain: consider diagnostic mammogram for worsening symptoms, use cabbage leaves as needed for inflammation, be sure to have proper fitting bra  6) Return in about 1 year (around 12/19/2023) for annual established gyn.   Rod Can, [facility] 12/18/2022, 1:33 PM

## 2022-12-22 LAB — CYTOLOGY - PAP
Comment: NEGATIVE
Diagnosis: NEGATIVE
High risk HPV: NEGATIVE

## 2023-02-19 IMAGING — CT CT ABD-PELV W/ CM
2 of 4 series · 17 of 46 positions shown, 19 images · IV contrast (APPLIED)
Comparison: 11/04/2019

CLINICAL DATA: Right lower quadrant abdominal pain

EXAM:
CT ABDOMEN AND PELVIS WITH CONTRAST
TECHNIQUE: Multidetector CT imaging of the abdomen and pelvis was performed
using the standard protocol following bolus administration of
intravenous contrast.

[Series 2: abdomen 5.0 · axial · 0.95mm/px · z∈[-916,-496]mm · 14 of 96 slices shown, 16 images]
[im 6/96  soft-tissue]
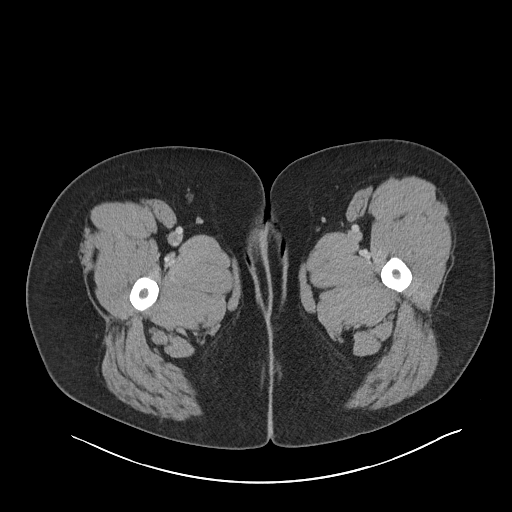
[im 6/96  bone]
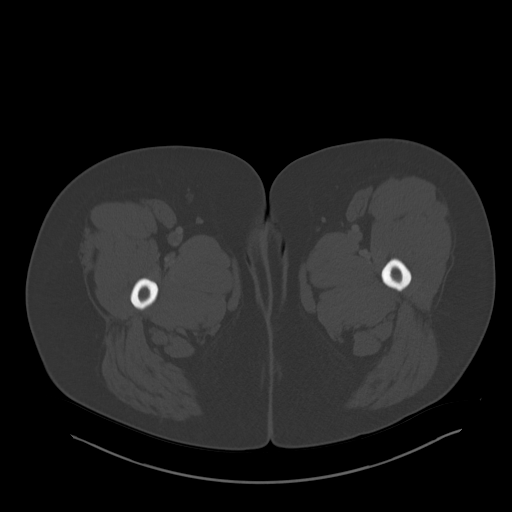
[im 11/96  soft-tissue]
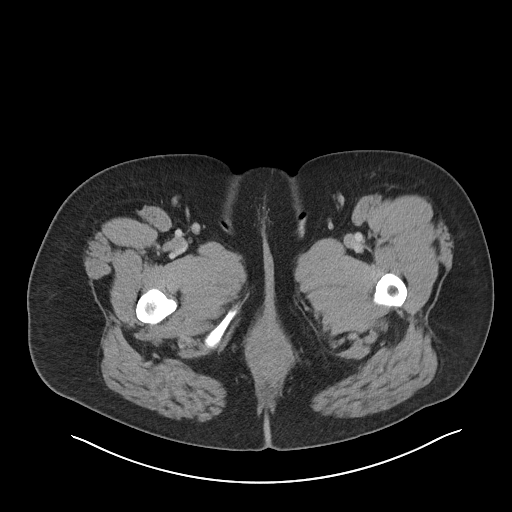
[im 22/96  soft-tissue]
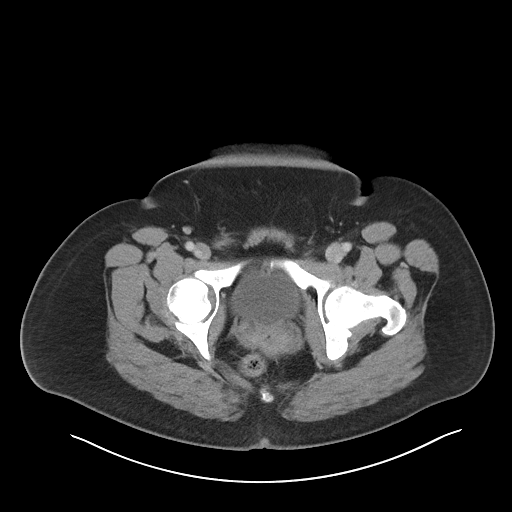
[im 27/96  soft-tissue]
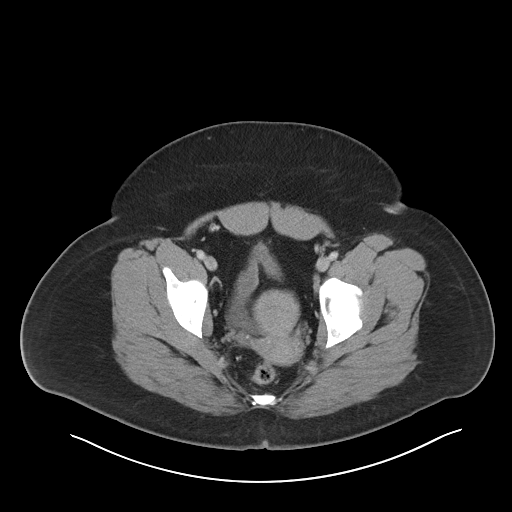
[im 32/96  soft-tissue]
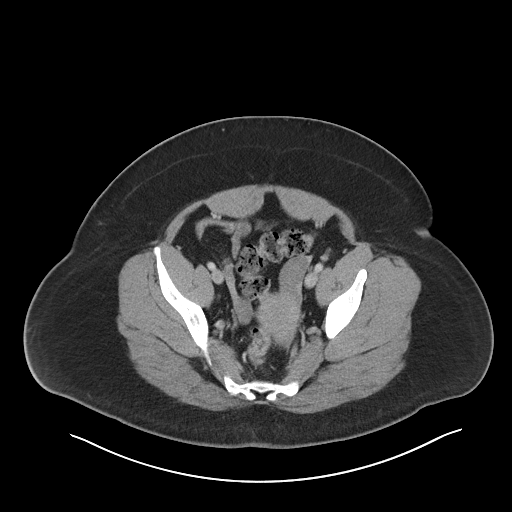
[im 37/96  soft-tissue]
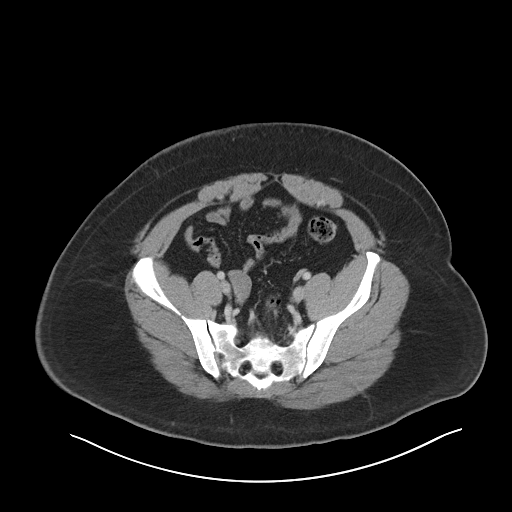
[im 43/96  soft-tissue]
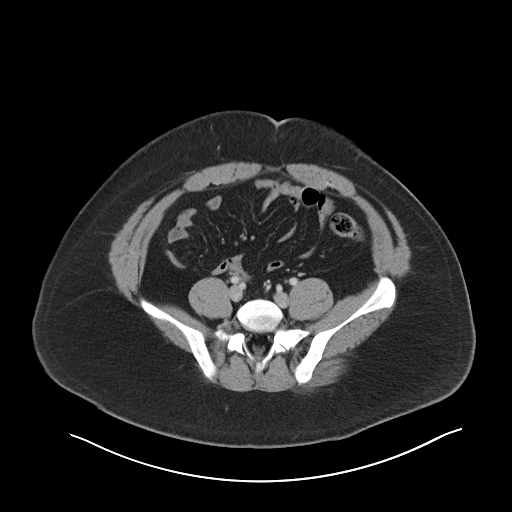
[im 53/96  soft-tissue]
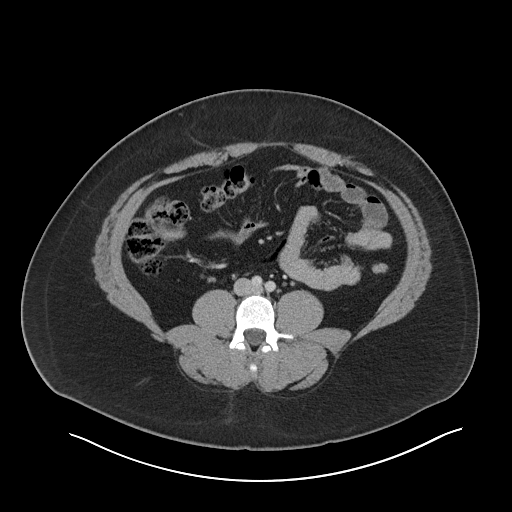
[im 59/96  soft-tissue]
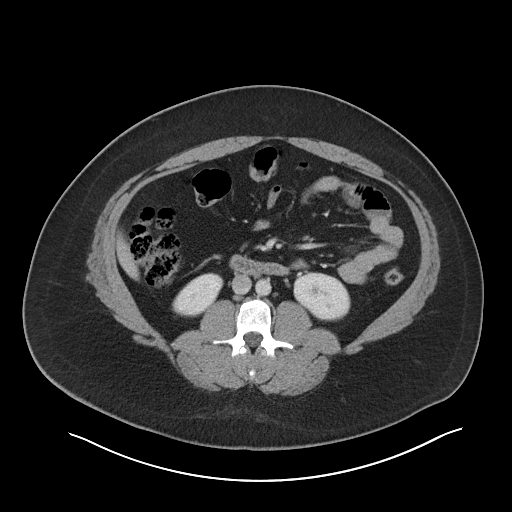
[im 59/96  bone]
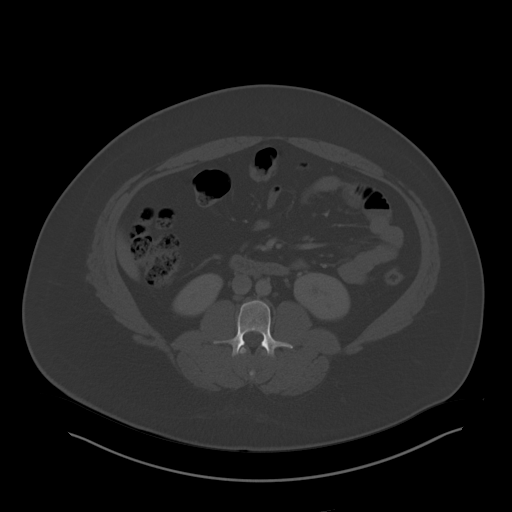
[im 64/96  soft-tissue]
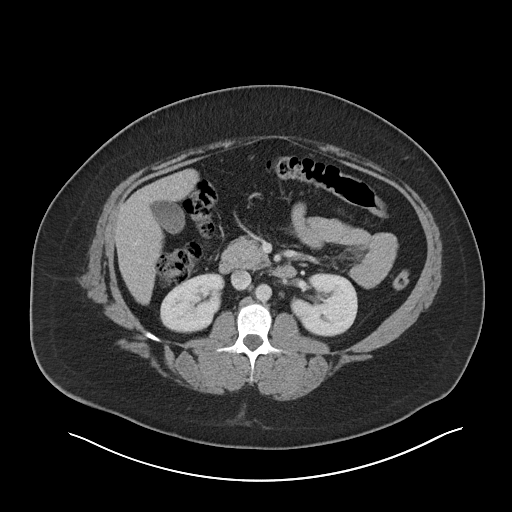
[im 69/96  soft-tissue]
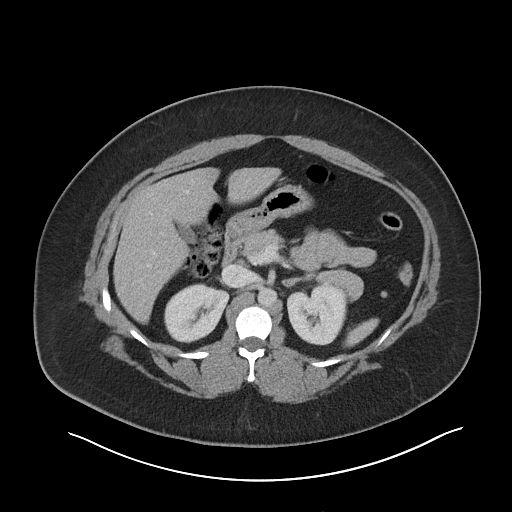
[im 74/96  soft-tissue]
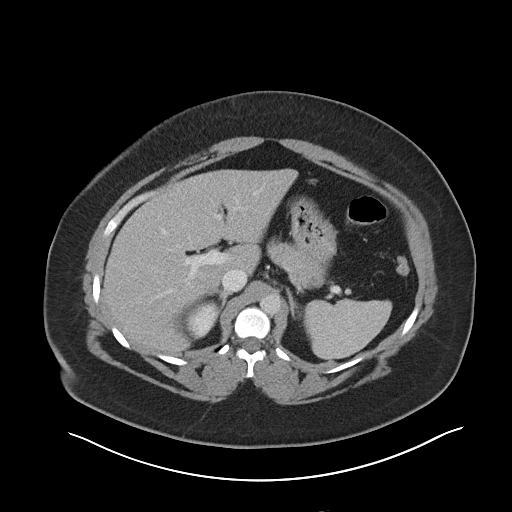
[im 85/96  soft-tissue]
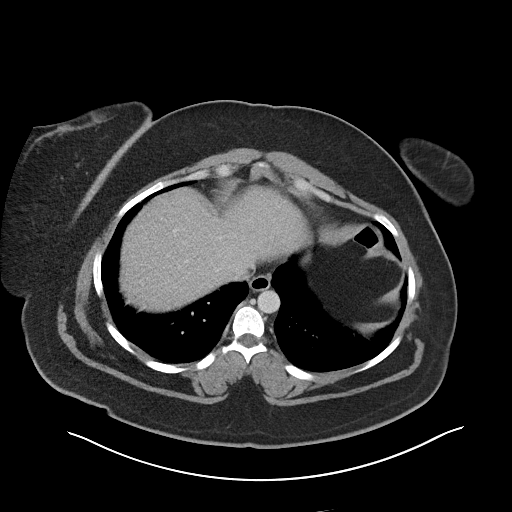
[im 90/96  soft-tissue]
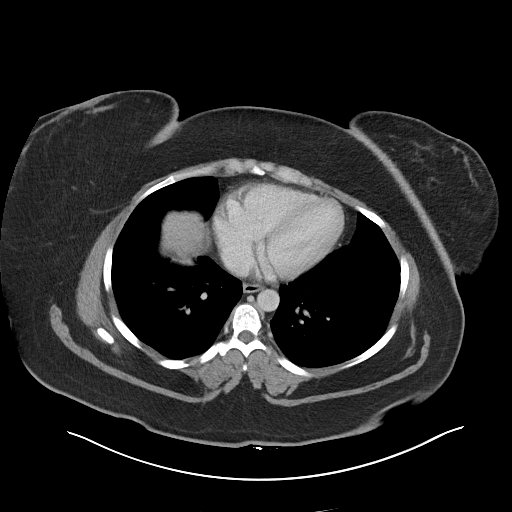

[Series 5: abdomen 3.0 mpr cor · coronal · 0.95mm/px · 3 of 111 slices shown]
[im 37/111  soft-tissue]
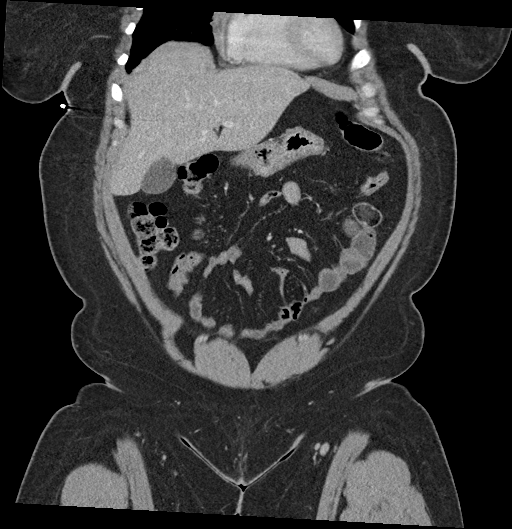
[im 49/111  soft-tissue]
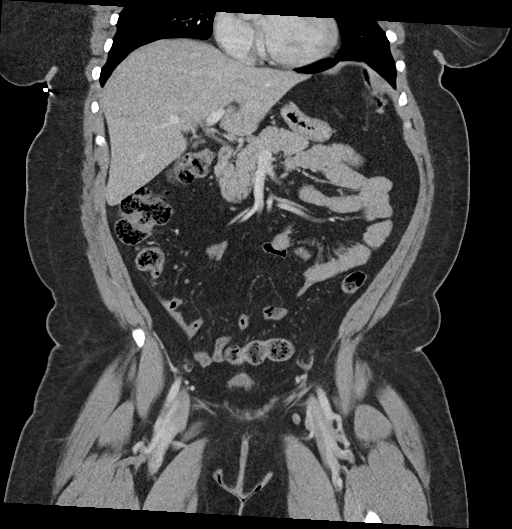
[im 62/111  soft-tissue]
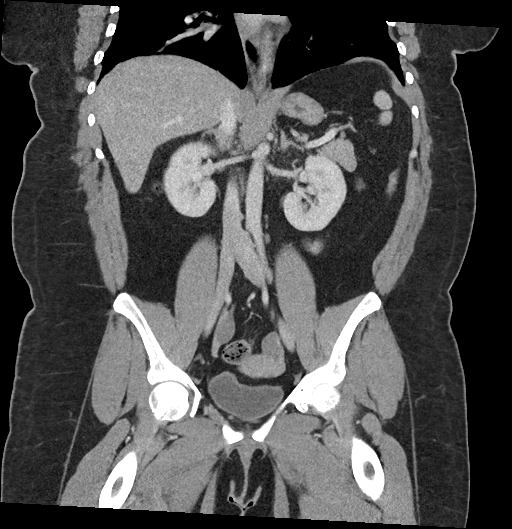

[17 of 46 positions shown; findings below may reference images not displayed]

RADIATION DOSE REDUCTION: This exam was performed according to the
departmental dose-optimization program which includes automated
exposure control, adjustment of the mA and/or kV according to
patient size and/or use of iterative reconstruction technique.

CONTRAST:  100mL OMNIPAQUE IOHEXOL 300 MG/ML  SOLN
FINDINGS: Lower chest: No acute abnormality.

Hepatobiliary: No focal liver abnormality is seen. No gallstones,
gallbladder wall thickening, or biliary dilatation.

Pancreas: Unremarkable. No pancreatic ductal dilatation or
surrounding inflammatory changes.

Spleen: Normal in size without focal abnormality.

Adrenals/Urinary Tract: Adrenal glands are unremarkable. Kidneys are
normal, without renal calculi, focal lesion, or hydronephrosis.
Bladder is unremarkable.

Stomach/Bowel: Stomach is within normal limits. Appendix appears
normal. No evidence of bowel wall thickening, distention, or
inflammatory changes.

Vascular/Lymphatic: No significant vascular findings are present. No
enlarged abdominal or pelvic lymph nodes.

Reproductive: Uterus appears normal. Bilateral ovarian enlargement
is identified compatible with the clinical history of polycystic
ovarian syndrome. No suspicious adnexal mass.

Other: No ascites or focal fluid collections. No signs of
pneumoperitoneum.

Musculoskeletal: No acute or significant osseous findings.
IMPRESSION: 1. No acute findings within the abdomen or pelvis.
2. Bilateral ovarian enlargement compatible with the clinical
history of polycystic ovarian syndrome.

## 2023-04-27 ENCOUNTER — Emergency Department
Admission: EM | Admit: 2023-04-27 | Discharge: 2023-04-27 | Disposition: A | Discharge status: Home / Self Care | Payer: No Typology Code available for payment source | Attending: Emergency Medicine | Admitting: Emergency Medicine

## 2023-04-27 ENCOUNTER — Encounter: Payer: Self-pay | Admitting: Emergency Medicine

## 2023-04-27 ENCOUNTER — Other Ambulatory Visit: Payer: Self-pay

## 2023-04-27 ENCOUNTER — Emergency Department: Payer: No Typology Code available for payment source

## 2023-04-27 DIAGNOSIS — R0789 Other chest pain: Secondary | ICD-10-CM | POA: Diagnosis present

## 2023-04-27 LAB — CBC
HCT: 39.5 % (ref 36.0–46.0)
Hemoglobin: 13.7 g/dL (ref 12.0–15.0)
MCH: 26.4 pg (ref 26.0–34.0)
MCHC: 34.7 g/dL (ref 30.0–36.0)
MCV: 76.3 fL — ABNORMAL LOW (ref 80.0–100.0)
Platelets: 414 10*3/uL — ABNORMAL HIGH (ref 150–400)
RBC: 5.18 MIL/uL — ABNORMAL HIGH (ref 3.87–5.11)
RDW: 15.3 % (ref 11.5–15.5)
WBC: 10.4 10*3/uL (ref 4.0–10.5)
nRBC: 0 % (ref 0.0–0.2)

## 2023-04-27 LAB — BASIC METABOLIC PANEL
Anion gap: 8 (ref 5–15)
BUN: 13 mg/dL (ref 6–20)
CO2: 24 mmol/L (ref 22–32)
Calcium: 9.2 mg/dL (ref 8.9–10.3)
Chloride: 107 mmol/L (ref 98–111)
Creatinine, Ser: 0.71 mg/dL (ref 0.44–1.00)
GFR, Estimated: >60 mL/min (ref >60)
Glucose, Bld: 86 mg/dL (ref 70–99)
Potassium: 3.9 mmol/L (ref 3.5–5.1)
Sodium: 139 mmol/L (ref 135–145)

## 2023-04-27 LAB — TROPONIN I (HIGH SENSITIVITY): Troponin I (High Sensitivity): 3 ng/L (ref <18)

## 2023-04-27 MED ORDER — NAPROXEN 375 MG PO TABS
375.0000 mg | ORAL_TABLET | Freq: Two times a day (BID) | ORAL | 0 refills | Status: AC
  Filled 2023-04-27: qty 14, 7d supply, fill #0

## 2023-04-27 NOTE — ED Provider Notes (Signed)
[facility] Provider Note    Event Date/Time   First MD Initiated Contact with Patient 04/27/23 1020     (approximate)   History   Chest Pain   HPI  Brenda Reese is a 32 y.o. female here with left-sided chest pain.  The patient states that while at work, she began to notice aching, throbbing, but positional left-sided chest pain under her left breast that radiates around towards her side.  This actually started yesterday but she noticed it more when she was at work today.  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  Denies any trauma.  Denies any numbness or weakness.  Denies any history of cardiac disease.  No history of blood clots.  No recent immobilization.     Physical Exam   Triage Vital Signs: ED Triage Vitals [04/27/23 1000]  Enc Vitals Group     BP (!) 127/104     Pulse Rate 88     Resp 20     Temp 97.9 F (36.6 C)     Temp Source Oral     SpO2 95 %     Weight 233 lb 14.5 oz (106.1 kg)     Height 5' 1.5" (1.562 m)     Head Circumference      Peak Flow      Pain Score 7     Pain Loc      Pain Edu?      Excl. in GC?     Most recent vital signs: Vitals:   04/27/23 1000  BP: (!) 127/104  Pulse: 88  Resp: 20  Temp: 97.9 F (36.6 C)  SpO2: 95%     General: Awake, no distress.  CV:  Good peripheral perfusion.  Regular rate and rhythm.  No murmurs. Resp:  Normal work of breathing.  Lungs clear to auscultation bilaterally. Abd:  No distention.  No left upper quadrant tenderness.  Epigastric tenderness.  No rebound or guarding. Other:  Moderate chest wall tenderness over the left lower parasternal intercostal spaces that radiates around towards the left flank.  No skin changes.  No lesions.   ED Results / Procedures / Treatments   Labs (all labs ordered are listed, but only abnormal results are displayed) Labs Reviewed  CBC - Abnormal; Notable for the following components:      Result Value   RBC 5.18 (*)     MCV 76.3 (*)    Platelets 414 (*)    All other components within normal limits  BASIC METABOLIC PANEL  POC URINE PREG, ED  TROPONIN I (HIGH SENSITIVITY)     EKG Normal sinus rhythm, trickle rate 84.  PR 154, QRS 80, QTc 420.  No acute ST elevations or depression.  Acute evidence of acute ischemia or infarct.   RADIOLOGY Chest x-ray: No active disease   I also independently reviewed and agree with radiologist interpretations.   PROCEDURES:  Critical Care performed: No   MEDICATIONS ORDERED IN ED: Medications - No data to display   IMPRESSION / MDM / ASSESSMENT AND PLAN / ED COURSE  I reviewed the triage vital signs and the nursing notes.                              Differential diagnosis includes, but is not limited to, chest wall pain, intercostal pain, costochondritis, ACS, pneumonia, pneumothorax, PE, gastritis  Patient's presentation is most  consistent with acute presentation with potential threat to life or bodily function.  32 year old female with no significant past medical history here with left-sided chest pain.  The pain is positional and reproducible on exam.  Suspect musculoskeletal chest wall pain.  Chest x-ray is clear with no evidence of pneumonia or pneumothorax.  CBC with no leukocytosis or anemia.  Troponin negative despite symptoms greater than 12 hours and I do not suspect ACS.  EKG is nonischemic.  BMP unremarkable.  She has no abdominal tenderness.  Will treat with NSAIDs, outpatient follow-up.    FINAL CLINICAL IMPRESSION(S) / ED DIAGNOSES   Final diagnoses:  Chest wall pain     Rx / DC Orders   ED Discharge Orders          Ordered    naproxen (NAPROSYN) 375 MG tablet  2 times daily with meals        04/27/23 1122             Note:  This document was prepared using Dragon voice recognition software and may include unintentional dictation errors.   Shaune Pollack, MD 04/27/23 [phone]

## 2023-04-27 NOTE — ED Triage Notes (Signed)
Pt here with cp that started last night. Pt states the she is feeling pain and a "knot" on the left side of her body. Pt states the cp is tightness that is intermittent. Pt denies NVD.

## 2023-04-27 NOTE — Discharge Instructions (Signed)
No heavy lifting >10 lb for the next week  Try to watch what you are doing repetitively at work, and ideally change the position/station you are on at least several times per week

## 2023-09-02 ENCOUNTER — Other Ambulatory Visit: Payer: Self-pay

## 2023-09-02 ENCOUNTER — Encounter: Payer: Self-pay | Admitting: *Deleted

## 2023-09-02 ENCOUNTER — Emergency Department
Admission: EM | Admit: 2023-09-02 | Discharge: 2023-09-02 | Disposition: A | Discharge status: Home / Self Care | Payer: No Typology Code available for payment source | Attending: Emergency Medicine | Admitting: Emergency Medicine

## 2023-09-02 DIAGNOSIS — J029 Acute pharyngitis, unspecified: Secondary | ICD-10-CM | POA: Insufficient documentation

## 2023-09-02 LAB — GROUP A STREP BY PCR: Group A Strep by PCR: NOT DETECTED

## 2023-09-02 MED ORDER — AMOXICILLIN 875 MG PO TABS: 875.0000 mg | ORAL_TABLET | Freq: Two times a day (BID) | ORAL | 0 refills | Status: AC

## 2023-09-02 NOTE — ED Triage Notes (Signed)
Pt is here for sore throat since yesterday, it is painful to swallow.

## 2023-09-02 NOTE — ED Provider Notes (Signed)
[facility] Provider Note    Event Date/Time   First MD Initiated Contact with Patient 09/02/23 0920     (approximate)   History   Sore Throat   HPI  Brenda Reese is a 32 y.o. female with history of seasonal allergies and PCOS presents emergency department with sore throat x 2 days.  No fever or chills.  No vomiting diarrhea.  No cough      Physical Exam   Triage Vital Signs: ED Triage Vitals  Encounter Vitals Group     BP 09/02/23 0914 (!) 137/97     Systolic BP Percentile --      Diastolic BP Percentile --      Pulse Rate 09/02/23 0914 99     Resp 09/02/23 0914 16     Temp 09/02/23 0914 98 F (36.7 C)     Temp Source 09/02/23 0914 Oral     SpO2 09/02/23 0914 96 %     Weight 09/02/23 0928 233 lb 14.5 oz (106.1 kg)     Height 09/02/23 0928 5' 1.5" (1.562 m)     Head Circumference --      Peak Flow --      Pain Score 09/02/23 0915 8     Pain Loc --      Pain Education --      Exclude from Growth Chart --     Most recent vital signs: Vitals:   09/02/23 0914  BP: (!) 137/97  Pulse: 99  Resp: 16  Temp: 98 F (36.7 C)  SpO2: 96%     General: Awake, no distress.   CV:  Good peripheral perfusion. regular rate and  rhythm Resp:  Normal effort. Lungs CTA Abd:  No distention.   Other: Department with bright red tonsils, no exudate, neck is supple, no lymphadenopathy   ED Results / Procedures / Treatments   Labs (all labs ordered are listed, but only abnormal results are displayed) Labs Reviewed  GROUP A STREP BY PCR     EKG     RADIOLOGY     PROCEDURES:   Procedures   MEDICATIONS ORDERED IN ED: Medications - No data to display   IMPRESSION / MDM / ASSESSMENT AND PLAN / ED COURSE  I reviewed the triage vital signs and the nursing notes.                              Differential diagnosis includes, but is not limited to, strep throat, pharyngitis, URI  Patient's presentation is most consistent  with acute illness / injury with system symptoms.   Strep test is reassuring  Did explain findings to patient.  Due to the appearance of the throat do feel like there is a pharyngeal infection.  Placed her on amoxicillin.  She is take Tylenol and ibuprofen.  Follow-up with her regular doctor if not improved in 3 days.  Return if worsening.  She is in agreement with treatment plan.  Discharged stable condition      FINAL CLINICAL IMPRESSION(S) / ED DIAGNOSES   Final diagnoses:  Pharyngitis, unspecified etiology     Rx / DC Orders   ED Discharge Orders          Ordered    amoxicillin (AMOXIL) 875 MG tablet  2 times daily        09/02/23 1001             Note:  This document was prepared using Dragon voice recognition software and may include unintentional dictation errors.    Faythe Ghee, PA-C 09/02/23 1005    Sharman Cheek, MD 09/03/23 [phone]

## 2024-10-11 ENCOUNTER — Other Ambulatory Visit: Payer: Self-pay

## 2024-10-11 ENCOUNTER — Encounter: Payer: Self-pay | Admitting: Emergency Medicine

## 2024-10-11 ENCOUNTER — Emergency Department
Admission: EM | Admit: 2024-10-11 | Discharge: 2024-10-11 | Disposition: A | Discharge status: Home / Self Care | Attending: Emergency Medicine | Admitting: Emergency Medicine

## 2024-10-11 DIAGNOSIS — M25511 Pain in right shoulder: Secondary | ICD-10-CM | POA: Diagnosis not present

## 2024-10-11 DIAGNOSIS — M7918 Myalgia, other site: Secondary | ICD-10-CM

## 2024-10-11 DIAGNOSIS — M542 Cervicalgia: Secondary | ICD-10-CM | POA: Insufficient documentation

## 2024-10-11 DIAGNOSIS — M62838 Other muscle spasm: Secondary | ICD-10-CM

## 2024-10-11 MED ORDER — ACETAMINOPHEN 325 MG PO TABS
650.0000 mg | ORAL_TABLET | Freq: Once | ORAL | Status: AC
  Administered 2024-10-11: 650 mg via ORAL
  Filled 2024-10-11: qty 2

## 2024-10-11 MED ORDER — LIDOCAINE 5 % EX PTCH
1.0000 | MEDICATED_PATCH | Freq: Two times a day (BID) | CUTANEOUS | 0 refills | Status: AC
  Filled 2024-10-11 (×2): qty 10, 5d supply, fill #0

## 2024-10-11 MED ORDER — LIDOCAINE 5 % EX PTCH
1.0000 | MEDICATED_PATCH | CUTANEOUS | Status: DC
  Administered 2024-10-11: 1 via TRANSDERMAL
  Filled 2024-10-11: qty 1

## 2024-10-11 NOTE — Discharge Instructions (Signed)
 You may apply warm compresses to the area as we discussed.  You may also use the patches on the area that is bothering you.  Please return for any new, worsening, or changing symptoms or other concerns such as weakness, numbness, tingling, worsening pain, or any other concerns.  It was a pleasure caring for you today.

## 2024-10-11 NOTE — ED Provider Notes (Signed)
 [facility] Provider Note    None    (approximate)   History   Neck Pain   HPI  Brenda Reese is a 33 y.o. female who presents today for evaluation of right neck and shoulder pain that began yesterday.  Patient denies any injuries.  She reports that it feels like a tight muscle.  She denies any radiation of pain into her fingers.  She still able to move her neck, though has pain when she does so.  No fevers or chills.  No weakness in her arm.  No fevers or chills.  She is right-hand dominant.  She denies chest pain, shortness of breath.  No history of PE/DVT.  Patient Active Problem List   Diagnosis Date Noted   Amenorrhea 04/23/2021   Bilateral low back pain 10/01/2017   Missed period 02/04/2016   PCOS (polycystic ovarian syndrome) 02/04/2016   Dietary counseling 07/01/2015   Obesity 07/01/2015          Physical Exam   Triage Vital Signs: ED Triage Vitals  Encounter Vitals Group     BP 10/11/24 0714 132/77     Girls Systolic BP Percentile --      Girls Diastolic BP Percentile --      Boys Systolic BP Percentile --      Boys Diastolic BP Percentile --      Pulse Rate 10/11/24 0714 84     Resp 10/11/24 0714 17     Temp 10/11/24 0714 98.2 F (36.8 C)     Temp Source 10/11/24 0714 Oral     SpO2 10/11/24 0714 97 %     Weight 10/11/24 0713 230 lb (104.3 kg)     Height 10/11/24 0713 5' 2 (1.575 m)     Head Circumference --      Peak Flow --      Pain Score 10/11/24 0712 8     Pain Loc --      Pain Education --      Exclude from Growth Chart --     Most recent vital signs: Vitals:   10/11/24 0714  BP: 132/77  Pulse: 84  Resp: 17  Temp: 98.2 F (36.8 C)  SpO2: 97%    Physical Exam Vitals and nursing note reviewed.  Constitutional:      General: Awake and alert. No acute distress.    Appearance: Normal appearance. The patient is normal weight.  HENT:     Head: Normocephalic and atraumatic.     Mouth: Mucous membranes  are moist.  Eyes:     General: PERRL. Normal EOMs        Right eye: No discharge.        Left eye: No discharge.     Conjunctiva/sclera: Conjunctivae normal.  Cardiovascular:     Rate and Rhythm: Normal rate and regular rhythm.     Pulses: Normal pulses.  Pulmonary:     Effort: Pulmonary effort is normal. No respiratory distress.     Breath sounds: Normal breath sounds.  Abdominal:     Abdomen is soft. There is no abdominal tenderness. No rebound or guarding. No distention. Musculoskeletal:        General: No swelling. Normal range of motion.     Cervical back: Normal range of motion and neck supple.  Tenderness to palpation to right trapezius muscle area.  No midline cervical spine tenderness.  Full range of motion of neck.  Negative Spurling test.  Negative Lhermitte sign.  Normal strength and sensation in bilateral upper extremities. Normal grip strength bilaterally.  Normal intrinsic muscle function of the hand bilaterally.  Normal radial pulses bilaterally. Skin:    General: Skin is warm and dry.     Capillary Refill: Capillary refill takes less than 2 seconds.     Findings: No rash.  Neurological:     Mental Status: The patient is awake and alert.      ED Results / Procedures / Treatments   Labs (all labs ordered are listed, but only abnormal results are displayed) Labs Reviewed - No data to display   EKG     RADIOLOGY     PROCEDURES:  Critical Care performed:   Procedures   MEDICATIONS ORDERED IN ED: Medications  lidocaine (LIDODERM) 5 % 1 patch (1 patch Transdermal Patch Applied 10/11/24 0727)  acetaminophen  (TYLENOL ) tablet 650 mg (650 mg Oral Given 10/11/24 0727)     IMPRESSION / MDM / ASSESSMENT AND PLAN / ED COURSE  I reviewed the triage vital signs and the nursing notes.   Differential diagnosis includes, but is not limited to, muscle spasm, muscle strain, cervical radiculopathy.  Patient is awake and alert, hemodynamically stable and  afebrile.  She has full normal range of motion of bilateral upper extremities, full and normal range of motion of her neck.  Negative Spurling and Lhermitte sign, negative Hoffmann sign.  She has normal strength and sensation throughout her bilateral upper extremities, normal grip strength bilaterally.  There are no radicular symptoms.  There is no midline tenderness.  Pain is reproducible with palpation of the trapezius muscle area.  Exam is most consistent with muscle strain versus spasm at this time.  She was treated symptomatically with Tylenol  and a Lidoderm patch with good effect.  Recommended warm compresses at home in addition.  We discussed return precautions at length.  Patient understands and agrees with plan.  She was discharged in stable condition.   Patient's presentation is most consistent with acute complicated illness / injury requiring diagnostic workup.      FINAL CLINICAL IMPRESSION(S) / ED DIAGNOSES   Final diagnoses:  Musculoskeletal pain  Trapezius muscle spasm     Rx / DC Orders   ED Discharge Orders          Ordered    lidocaine (LIDODERM) 5 %  Every 12 hours        10/11/24 0745             Note:  This document was prepared using Dragon voice recognition software and may include unintentional dictation errors.   Trajon Rosete E, PA-C 10/11/24 1236    Dorothyann Drivers, MD 10/11/24 1517

## 2024-10-11 NOTE — ED Triage Notes (Signed)
 Patient to ED via POV for neck pain that radiates down into right shoulder. Woke up with the pain yesterday. Denies injury.

## 2024-10-12 ENCOUNTER — Other Ambulatory Visit: Payer: Self-pay

## 2024-10-12 ENCOUNTER — Emergency Department

## 2024-10-12 ENCOUNTER — Emergency Department
Admission: EM | Admit: 2024-10-12 | Discharge: 2024-10-12 | Disposition: A | Discharge status: Home / Self Care | Attending: Emergency Medicine | Admitting: Emergency Medicine

## 2024-10-12 DIAGNOSIS — M542 Cervicalgia: Secondary | ICD-10-CM | POA: Diagnosis present

## 2024-10-12 DIAGNOSIS — M50222 Other cervical disc displacement at C5-C6 level: Secondary | ICD-10-CM | POA: Diagnosis not present

## 2024-10-12 DIAGNOSIS — M62838 Other muscle spasm: Secondary | ICD-10-CM | POA: Insufficient documentation

## 2024-10-12 LAB — POC URINE PREG, ED: Preg Test, Ur: NEGATIVE

## 2024-10-12 MED ORDER — PREDNISONE 10 MG (21) PO TBPK: ORAL_TABLET | ORAL | 0 refills | Status: AC

## 2024-10-12 MED ORDER — KETOROLAC TROMETHAMINE 15 MG/ML IJ SOLN
15.0000 mg | Freq: Once | INTRAMUSCULAR | Status: AC
  Administered 2024-10-12: 15 mg via INTRAMUSCULAR
  Filled 2024-10-12: qty 1

## 2024-10-12 MED ORDER — NAPROXEN 500 MG PO TABS: 500.0000 mg | ORAL_TABLET | Freq: Two times a day (BID) | ORAL | 0 refills | Status: AC

## 2024-10-12 MED ORDER — ACETAMINOPHEN 325 MG PO TABS
650.0000 mg | ORAL_TABLET | Freq: Once | ORAL | Status: AC
  Administered 2024-10-12: 650 mg via ORAL
  Filled 2024-10-12: qty 2

## 2024-10-12 NOTE — ED Triage Notes (Signed)
 C/O continued neck pain. Seen for same yesterday reports no improvement.

## 2024-10-12 NOTE — Discharge Instructions (Signed)
 Please return if you develop any new, worsening, or changing symptoms or other concerns including weakness in your arms or hands, numbness, tingling, or any other new, worsening, or changing symptoms or other concerns.  It was a pleasure caring for you today.

## 2024-10-12 NOTE — ED Provider Notes (Signed)
 [facility] Provider Note    Event Date/Time   First MD Initiated Contact with Patient 10/12/24 [phone]     (approximate)   History   Neck Pain   HPI  Meryem Haertel Chou is a 33 y.o. female who presents today for evaluation of neck and right sided shoulder pain for the past 2 to 3 days.  Patient came yesterday with the same complaint.  No new injuries or symptoms.  She denies any pain that radiates down into her fingers.  She reports that the Lidoderm patches are not helping.  She reports that she has tightness in her muscle and has a hard time turning her head.  Patient Active Problem List   Diagnosis Date Noted   Amenorrhea 04/23/2021   Bilateral low back pain 10/01/2017   Missed period 02/04/2016   PCOS (polycystic ovarian syndrome) 02/04/2016   Dietary counseling 07/01/2015   Obesity 07/01/2015          Physical Exam   Triage Vital Signs: ED Triage Vitals  Encounter Vitals Group     BP 10/12/24 0756 (!) 138/98     Girls Systolic BP Percentile --      Girls Diastolic BP Percentile --      Boys Systolic BP Percentile --      Boys Diastolic BP Percentile --      Pulse Rate 10/12/24 0756 90     Resp 10/12/24 0756 16     Temp 10/12/24 0756 97.6 F (36.4 C)     Temp Source 10/12/24 0756 Oral     SpO2 10/12/24 0756 98 %     Weight 10/12/24 0757 229 lb 15 oz (104.3 kg)     Height --      Head Circumference --      Peak Flow --      Pain Score 10/12/24 0757 9     Pain Loc --      Pain Education --      Exclude from Growth Chart --     Most recent vital signs: Vitals:   10/12/24 0756 10/12/24 0811  BP: (!) 138/98   Pulse: 90   Resp: 16   Temp: 97.6 F (36.4 C)   SpO2: 98% 98%    Physical Exam Vitals and nursing note reviewed.  Constitutional:      General: Awake and alert. No acute distress.    Appearance: Normal appearance. The patient is normal weight.  HENT:     Head: Normocephalic and atraumatic.     Mouth: Mucous  membranes are moist.  Eyes:     General: PERRL. Normal EOMs        Right eye: No discharge.        Left eye: No discharge.     Conjunctiva/sclera: Conjunctivae normal.  Cardiovascular:     Rate and Rhythm: Normal rate and regular rhythm.     Pulses: Normal pulses.  Pulmonary:     Effort: Pulmonary effort is normal. No respiratory distress.     Breath sounds: Normal breath sounds.  Abdominal:     Abdomen is soft. There is no abdominal tenderness. No rebound or guarding. No distention. Musculoskeletal:        General: No swelling. Normal range of motion.     Cervical back: Intact range of motion with flexion and extension, and tightness with turning her head to the left and right, though able to do this.  Neck is supple.  No midline cervical spine tenderness.  Tenderness palpation to right trapezius muscle area.  Full normal range of motion of shoulder, elbow, and wrist of both sides.  Full range of motion of neck.  Reproduction of symptoms with Spurling test.  Negative Lhermitte sign.  Normal strength and sensation in bilateral upper extremities. Normal grip strength bilaterally.  Normal intrinsic muscle function of the hand bilaterally.  Normal radial pulses bilaterally. Skin:    General: Skin is warm and dry.     Capillary Refill: Capillary refill takes less than 2 seconds.     Findings: No rash.  Neurological:     Mental Status: The patient is awake and alert.      ED Results / Procedures / Treatments   Labs (all labs ordered are listed, but only abnormal results are displayed) Labs Reviewed  POC URINE PREG, ED     EKG     RADIOLOGY I independently reviewed and interpreted imaging and agree with radiologists findings.     PROCEDURES:  Critical Care performed:   Procedures   MEDICATIONS ORDERED IN ED: Medications  ketorolac (TORADOL) 15 MG/ML injection 15 mg (15 mg Intramuscular Given 10/12/24 0903)  acetaminophen  (TYLENOL ) tablet 650 mg (650 mg Oral Given  10/12/24 0902)     IMPRESSION / MDM / ASSESSMENT AND PLAN / ED COURSE  I reviewed the triage vital signs and the nursing notes.   Differential diagnosis includes, but is not limited to, muscle strain, muscle spasm, radiculopathy.  Patient is awake and alert, hemodynamically stable and afebrile.  She has normal strength and sensation of bilateral upper extremities.  I reviewed the patient's chart.  I saw this patient yesterday.  Given her repeat visit, we will obtain CT scan.  Patient is in agreement with this plan.  CT scan does not reveal any acute osseous injury, though does reveal C5-C6 disc endplate degeneration with rightward disc protrusion.  This is the side that she has symptoms.  However, her symptoms stop at her shoulder and do not travel down to her fingertips.  Negative Hoffmann sign.  Will start her on steroidal and nonsteroidal anti-inflammatories.  We discussed the importance of close outpatient follow-up with neurosurgery and the appropriate follow-up information was provided.  We also discussed the potential for PT if symptoms persist. She was given a work note per her mother's request.  Patient was also given the information for occupational health.  We discussed strict return precautions and the importance of close outpatient follow-up.  Patient understands and agrees with plan.  Discharged in stable condition.   Patient's presentation is most consistent with acute complicated illness / injury requiring diagnostic workup.   FINAL CLINICAL IMPRESSION(S) / ED DIAGNOSES   Final diagnoses:  Displacement of intervertebral disc at C5-C6 level  Muscle spasms of neck     Rx / DC Orders   ED Discharge Orders          Ordered    predniSONE  (STERAPRED UNI-PAK 21 TAB) 10 MG (21) TBPK tablet        10/12/24 0935    naproxen  (NAPROSYN ) 500 MG tablet  2 times daily with meals        10/12/24 0935             Note:  This document was prepared using Dragon voice  recognition software and may include unintentional dictation errors.   Taneal Sonntag E, PA-C 10/12/24 1354    Viviann Pastor, MD 10/14/24 2352
